# Patient Record
Sex: Female | Born: 1985 | Race: Black or African American | Hispanic: No | Smoking: Never smoker
Health system: Southern US, Community
[De-identification: ages and names within clinical notes are randomized; demographics above are authoritative.]

## PROBLEM LIST (undated history)

## (undated) ENCOUNTER — Inpatient Hospital Stay (HOSPITAL_COMMUNITY): Payer: Self-pay

## (undated) DIAGNOSIS — F32A Depression, unspecified: Secondary | ICD-10-CM

## (undated) DIAGNOSIS — F419 Anxiety disorder, unspecified: Secondary | ICD-10-CM

## (undated) DIAGNOSIS — D649 Anemia, unspecified: Secondary | ICD-10-CM

## (undated) DIAGNOSIS — R87629 Unspecified abnormal cytological findings in specimens from vagina: Secondary | ICD-10-CM

## (undated) DIAGNOSIS — M549 Dorsalgia, unspecified: Secondary | ICD-10-CM

## (undated) DIAGNOSIS — F329 Major depressive disorder, single episode, unspecified: Secondary | ICD-10-CM

## (undated) DIAGNOSIS — J45909 Unspecified asthma, uncomplicated: Secondary | ICD-10-CM

## (undated) HISTORY — PX: NO PAST SURGERIES: SHX2092

---
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
## 2004-06-06 ENCOUNTER — Emergency Department (HOSPITAL_COMMUNITY): Admission: EM | Admit: 2004-06-06 | Discharge: 2004-06-07 | Payer: Self-pay | Admitting: Emergency Medicine

## 2004-07-13 ENCOUNTER — Encounter: Admission: RE | Admit: 2004-07-13 | Discharge: 2004-07-13 | Payer: Self-pay | Admitting: Family Medicine

## 2008-02-09 ENCOUNTER — Encounter: Admission: RE | Admit: 2008-02-09 | Discharge: 2008-02-09 | Payer: Self-pay | Admitting: Internal Medicine

## 2011-07-18 ENCOUNTER — Emergency Department (HOSPITAL_COMMUNITY)
Admission: EM | Admit: 2011-07-18 | Discharge: 2011-07-18 | Disposition: A | Discharge status: Home / Self Care | Payer: Medicare HMO | Attending: Emergency Medicine | Admitting: Emergency Medicine

## 2011-07-18 DIAGNOSIS — X58XXXA Exposure to other specified factors, initial encounter: Secondary | ICD-10-CM | POA: Insufficient documentation

## 2011-07-18 DIAGNOSIS — B009 Herpesviral infection, unspecified: Secondary | ICD-10-CM | POA: Insufficient documentation

## 2011-07-18 DIAGNOSIS — R220 Localized swelling, mass and lump, head: Secondary | ICD-10-CM | POA: Insufficient documentation

## 2011-07-18 DIAGNOSIS — T7840XA Allergy, unspecified, initial encounter: Secondary | ICD-10-CM | POA: Insufficient documentation

## 2011-07-18 MED ORDER — DIPHENHYDRAMINE HCL 25 MG PO TABS: 50.0000 mg | ORAL_TABLET | Freq: Three times a day (TID) | ORAL | Status: DC | PRN

## 2011-07-18 MED ORDER — FAMOTIDINE 20 MG PO TABS
20.0000 mg | ORAL_TABLET | Freq: Once | ORAL | Status: AC
  Administered 2011-07-18: 20 mg via ORAL
  Filled 2011-07-18: qty 1

## 2011-07-18 MED ORDER — PREDNISONE 20 MG PO TABS: 60.0000 mg | ORAL_TABLET | Freq: Every day | ORAL | Status: AC

## 2011-07-18 MED ORDER — ACYCLOVIR 5 % EX OINT: TOPICAL_OINTMENT | CUTANEOUS | Status: AC

## 2011-07-18 MED ORDER — PREDNISONE 20 MG PO TABS
60.0000 mg | ORAL_TABLET | Freq: Once | ORAL | Status: AC
  Administered 2011-07-18: 60 mg via ORAL
  Filled 2011-07-18: qty 3

## 2011-07-18 MED ORDER — ACYCLOVIR 5 % EX OINT: TOPICAL_OINTMENT | CUTANEOUS | Status: DC

## 2011-07-18 MED ORDER — FAMOTIDINE 20 MG PO TABS: 20.0000 mg | ORAL_TABLET | Freq: Two times a day (BID) | ORAL | Status: DC

## 2011-07-18 MED ORDER — DIPHENHYDRAMINE HCL 25 MG PO CAPS
50.0000 mg | ORAL_CAPSULE | Freq: Once | ORAL | Status: AC
  Administered 2011-07-18: 50 mg via ORAL
  Filled 2011-07-18: qty 1

## 2011-07-18 NOTE — ED Provider Notes (Signed)
Medical screening examination/treatment/procedure(s) were performed by non-physician practitioner and as supervising physician I was immediately available for consultation/collaboration.   Rolan Bucco, MD 07/18/11 [phone]

## 2011-07-18 NOTE — ED Notes (Signed)
Pt states she woke up this am and she is swollen all over, her face, lips, hands, legs and feet

## 2011-07-18 NOTE — ED Provider Notes (Signed)
History     CSN: [id number]  Arrival date & time 07/18/11  1191   First MD Initiated Contact with Patient 07/18/11 1043      Chief Complaint  Patient presents with  . Facial Swelling  . Allergic Reaction    (Consider location/radiation/quality/duration/timing/severity/associated sxs/prior treatment) Patient is a 25 y.o. female presenting with allergic reaction. The history is provided by the patient.  Allergic Reaction The primary symptoms do not include shortness of breath, abdominal pain or rash. Primary symptoms comment: She woke this morning with feeling her hands and feet were swollen.  The current episode started 3 to 5 hours ago. The problem has not changed since onset. Significant symptoms that are not present include eye redness or itching. Associated symptoms comments: She reports swollen and painful rash on lips for the past 2 weeks that is improving. She has been taking 25 mg Benadryl twice daily for this..    Past Medical History  Diagnosis Date  . Migraine     History reviewed. No pertinent past surgical history.  History reviewed. No pertinent family history.  History  Substance Use Topics  . Smoking status: Not on file  . Smokeless tobacco: Not on file  . Alcohol Use: No    OB History    Grav Para Term Preterm Abortions TAB SAB Ect Mult Living                  Review of Systems  Constitutional: Negative for fever and chills.  HENT: Negative.   Eyes: Negative for redness.  Respiratory: Negative.  Negative for shortness of breath.   Cardiovascular: Negative.   Gastrointestinal: Negative.  Negative for abdominal pain.  Musculoskeletal:       See HPI. No joint pain or myalgias. She reports symptoms limited to an 'uncomfortable' swelling or tightness in hands and feet.  Skin: Negative.  Negative for itching and rash.  Neurological: Negative.     Allergies  Review of patient's allergies indicates no known allergies.  Home Medications   Current  Outpatient Rx  Name Route Sig Dispense Refill  . ACETAMINOPHEN 325 MG PO TABS Oral Take 650 mg by mouth every 6 (six) hours as needed. For pain.     . ALBUTEROL SULFATE HFA 108 (90 BASE) MCG/ACT IN AERS Inhalation Inhale 2 puffs into the lungs every 4 (four) hours as needed. For shortness of breath.     . CETIRIZINE HCL 10 MG PO TABS Oral Take 10 mg by mouth daily.      Marland Kitchen VITAMIN D 1000 UNITS PO TABS Oral Take 1,000 Units by mouth daily.      Marland Kitchen METHOCARBAMOL 500 MG PO TABS Oral Take 500 mg by mouth every 6 (six) hours as needed. For muscle spasms.    Gaylord Shih TRI-CYCLEN (28) PO Oral Take 1 tablet by mouth daily.      Marland Kitchen OVER THE COUNTER MEDICATION Topical Apply 1 application topically as needed. Triderma Eczema Fast Healing Cream. For eczema.     . TOPIRAMATE 25 MG PO TABS Oral Take 25 mg by mouth 2 (two) times daily.        BP 111/69  Pulse 68  Temp(Src) 98.8 F (37.1 C) (Oral)  Resp 18  SpO2 100%  Physical Exam  Constitutional: She appears well-developed and well-nourished.  HENT:  Head: Normocephalic.  Mouth/Throat: Oropharynx is clear and moist.       Rash on lower center lip associated with mild swelling. No blistering or ulcerations.  Neck: Normal range of motion. Neck supple.  Cardiovascular: Normal rate and regular rhythm.   Pulmonary/Chest: Effort normal and breath sounds normal.  Abdominal: Soft. Bowel sounds are normal. There is no tenderness. There is no rebound and no guarding.  Musculoskeletal: Normal range of motion.  Neurological: She is alert. No cranial nerve deficit.  Skin: Skin is warm and dry. No rash noted.  Psychiatric: She has a normal mood and affect.    ED Course  Procedures (including critical care time)  Labs Reviewed - No data to display No results found.   No diagnosis found.    MDM  She reports she feels better with medications in ED.         Rodena Medin, PA 07/18/11 1213

## 2013-03-27 ENCOUNTER — Encounter (HOSPITAL_COMMUNITY): Payer: Self-pay | Admitting: *Deleted

## 2013-03-27 ENCOUNTER — Inpatient Hospital Stay (HOSPITAL_COMMUNITY): Payer: Managed Care, Other (non HMO)

## 2013-03-27 ENCOUNTER — Inpatient Hospital Stay (HOSPITAL_COMMUNITY)
Admission: AD | Admit: 2013-03-27 | Discharge: 2013-03-27 | Disposition: A | Discharge status: Home / Self Care | Payer: Managed Care, Other (non HMO) | Source: Ambulatory Visit | Attending: Obstetrics & Gynecology | Admitting: Obstetrics & Gynecology

## 2013-03-27 DIAGNOSIS — O99891 Other specified diseases and conditions complicating pregnancy: Secondary | ICD-10-CM | POA: Insufficient documentation

## 2013-03-27 DIAGNOSIS — O209 Hemorrhage in early pregnancy, unspecified: Secondary | ICD-10-CM | POA: Insufficient documentation

## 2013-03-27 DIAGNOSIS — J45901 Unspecified asthma with (acute) exacerbation: Secondary | ICD-10-CM

## 2013-03-27 DIAGNOSIS — R109 Unspecified abdominal pain: Secondary | ICD-10-CM | POA: Insufficient documentation

## 2013-03-27 HISTORY — DX: Dorsalgia, unspecified: M54.9

## 2013-03-27 HISTORY — DX: Unspecified asthma, uncomplicated: J45.909

## 2013-03-27 HISTORY — DX: Anemia, unspecified: D64.9

## 2013-03-27 LAB — WET PREP, GENITAL
Clue Cells Wet Prep HPF POC: NONE SEEN
Trich, Wet Prep: NONE SEEN
Yeast Wet Prep HPF POC: NONE SEEN

## 2013-03-27 LAB — CBC
HCT: 36.6 % (ref 36.0–46.0)
Hemoglobin: 12.6 g/dL (ref 12.0–15.0)
MCH: 29.5 pg (ref 26.0–34.0)
MCHC: 34.4 g/dL (ref 30.0–36.0)
MCV: 85.7 fL (ref 78.0–100.0)
Platelets: 282 10*3/uL (ref 150–400)
RBC: 4.27 MIL/uL (ref 3.87–5.11)
RDW: 12.8 % (ref 11.5–15.5)
WBC: 6.5 10*3/uL (ref 4.0–10.5)

## 2013-03-27 LAB — ABO/RH: ABO/RH(D): B POS

## 2013-03-27 MED ORDER — ALBUTEROL SULFATE (5 MG/ML) 0.5% IN NEBU
INHALATION_SOLUTION | RESPIRATORY_TRACT | Status: AC
  Administered 2013-03-27: 2.5 mg via RESPIRATORY_TRACT
  Filled 2013-03-27: qty 0.5

## 2013-03-27 MED ORDER — CONCEPT DHA 53.5-38-1 MG PO CAPS: 1.0000 | ORAL_CAPSULE | Freq: Every day | ORAL | Status: DC

## 2013-03-27 MED ORDER — IPRATROPIUM BROMIDE 0.02 % IN SOLN
RESPIRATORY_TRACT | Status: AC
  Administered 2013-03-27: 0.5 mg via RESPIRATORY_TRACT
  Filled 2013-03-27: qty 2.5

## 2013-03-27 MED ORDER — IPRATROPIUM BROMIDE 0.02 % IN SOLN
0.5000 mg | Freq: Once | RESPIRATORY_TRACT | Status: AC
  Administered 2013-03-27: 0.5 mg via RESPIRATORY_TRACT

## 2013-03-27 MED ORDER — ALBUTEROL SULFATE (5 MG/ML) 0.5% IN NEBU
2.5000 mg | INHALATION_SOLUTION | Freq: Once | RESPIRATORY_TRACT | Status: AC
  Administered 2013-03-27: 2.5 mg via RESPIRATORY_TRACT
  Filled 2013-03-27: qty 0.5

## 2013-03-27 MED ORDER — ALBUTEROL SULFATE (5 MG/ML) 0.5% IN NEBU
2.5000 mg | INHALATION_SOLUTION | Freq: Once | RESPIRATORY_TRACT | Status: AC
  Administered 2013-03-27: 2.5 mg via RESPIRATORY_TRACT

## 2013-03-27 MED ORDER — IPRATROPIUM BROMIDE 0.02 % IN SOLN
0.5000 mg | Freq: Once | RESPIRATORY_TRACT | Status: AC
  Administered 2013-03-27: 0.5 mg via RESPIRATORY_TRACT
  Filled 2013-03-27: qty 2.5

## 2013-03-27 NOTE — MAU Provider Note (Signed)
Pt had an acute asthma exacerbation.   She responded to nebulizer in MAU and was discharged to home to f/u with primary care.  Pt was stable at discharge. Attestation of Attending Supervision of Advanced Practitioner (CNM/NP): Evaluation and management procedures were performed by the Advanced Practitioner under my supervision and collaboration.  I have reviewed the Advanced Practitioner's note and chart, and I agree with the management and plan.  HARRAWAY-SMITH, Delma Drone 4:27 PM

## 2013-03-27 NOTE — MAU Note (Signed)
C/o asthmatic symptoms since this AM around 0700;

## 2013-03-27 NOTE — MAU Provider Note (Signed)
Attestation of Attending Supervision of Advanced Practitioner (CNM/NP): Evaluation and management procedures were performed by the Advanced Practitioner under my supervision and collaboration.  I have reviewed the Advanced Practitioner's note and chart, and I agree with the management and plan.  HARRAWAY-SMITH, Ramonica Grigg 8:27 PM     
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
## 2013-03-28 LAB — GC/CHLAMYDIA PROBE AMP
CT Probe RNA: NEGATIVE
GC Probe RNA: NEGATIVE

## 2013-04-12 LAB — OB RESULTS CONSOLE ABO/RH: RH Type: POSITIVE

## 2013-04-12 LAB — OB RESULTS CONSOLE HIV ANTIBODY (ROUTINE TESTING): HIV: NONREACTIVE

## 2013-04-12 LAB — OB RESULTS CONSOLE RUBELLA ANTIBODY, IGM: Rubella: IMMUNE

## 2013-04-12 LAB — OB RESULTS CONSOLE ANTIBODY SCREEN: Antibody Screen: NEGATIVE

## 2013-04-12 LAB — OB RESULTS CONSOLE RPR: RPR: NONREACTIVE

## 2013-04-12 LAB — OB RESULTS CONSOLE HEPATITIS B SURFACE ANTIGEN: Hepatitis B Surface Ag: NEGATIVE

## 2013-04-20 LAB — OB RESULTS CONSOLE GC/CHLAMYDIA
Chlamydia: NEGATIVE
Gonorrhea: NEGATIVE

## 2013-05-15 ENCOUNTER — Inpatient Hospital Stay (HOSPITAL_COMMUNITY)
Admission: AD | Admit: 2013-05-15 | Payer: Managed Care, Other (non HMO) | Source: Ambulatory Visit | Admitting: Obstetrics and Gynecology

## 2013-07-26 NOTE — L&D Delivery Note (Signed)
Delivery Note At 4:28 AM a healthy female was delivered via Vaginal, Spontaneous Delivery (Presentation: Left Occiput Anterior).  APGAR: 8, 10; weight pending .   Placenta status: Intact, Spontaneous.  Cord: 3 vessels with the following complications: None.  Cord pH: N/A  Anesthesia: None  Episiotomy: None Lacerations: 2nd degree Suture Repair: 3.0 vicryl vicryl rapide 4-0 Est. Blood Loss (mL):   Mom to postpartum.  Baby to Nursery.  Tracy Patel 11/05/2013, 5:02 AM

## 2013-10-19 LAB — OB RESULTS CONSOLE GBS: GBS: NEGATIVE

## 2013-11-04 ENCOUNTER — Encounter (HOSPITAL_COMMUNITY): Payer: Self-pay | Admitting: *Deleted

## 2013-11-04 ENCOUNTER — Encounter (HOSPITAL_COMMUNITY): Payer: Self-pay

## 2013-11-04 ENCOUNTER — Inpatient Hospital Stay (HOSPITAL_COMMUNITY)
Admission: AD | Admit: 2013-11-04 | Discharge: 2013-11-04 | Disposition: A | Discharge status: Home / Self Care | Payer: Managed Care, Other (non HMO) | Source: Ambulatory Visit | Attending: Obstetrics and Gynecology | Admitting: Obstetrics and Gynecology

## 2013-11-04 ENCOUNTER — Inpatient Hospital Stay (HOSPITAL_COMMUNITY)
Admission: AD | Admit: 2013-11-04 | Discharge: 2013-11-07 | DRG: 775 | Disposition: A | Discharge status: Home / Self Care | Payer: Managed Care, Other (non HMO) | Source: Ambulatory Visit | Attending: Obstetrics & Gynecology | Admitting: Obstetrics & Gynecology

## 2013-11-04 DIAGNOSIS — O479 False labor, unspecified: Secondary | ICD-10-CM | POA: Insufficient documentation

## 2013-11-04 HISTORY — DX: Major depressive disorder, single episode, unspecified: F32.9

## 2013-11-04 HISTORY — DX: Anxiety disorder, unspecified: F41.9

## 2013-11-04 HISTORY — DX: Depression, unspecified: F32.A

## 2013-11-04 HISTORY — DX: Unspecified abnormal cytological findings in specimens from vagina: R87.629

## 2013-11-04 LAB — AMNISURE RUPTURE OF MEMBRANE (ROM) NOT AT ARMC: Amnisure ROM: POSITIVE

## 2013-11-04 NOTE — MAU Note (Signed)
Water broke at 10 pm, clear fluid. Contractions every 5 minutes.

## 2013-11-04 NOTE — MAU Note (Signed)
Pt complains of contractions that started around 330am. Denies vaginal discharge, bleeding or leaking of fluid. Reports good fetal movement.

## 2013-11-05 ENCOUNTER — Encounter (HOSPITAL_COMMUNITY): Payer: Self-pay | Admitting: *Deleted

## 2013-11-05 LAB — CBC
HCT: 41.1 % (ref 36.0–46.0)
Hemoglobin: 14.6 g/dL (ref 12.0–15.0)
MCH: 30.5 pg (ref 26.0–34.0)
MCHC: 35.5 g/dL (ref 30.0–36.0)
MCV: 86 fL (ref 78.0–100.0)
Platelets: 258 10*3/uL (ref 150–400)
RBC: 4.78 MIL/uL (ref 3.87–5.11)
RDW: 13.9 % (ref 11.5–15.5)
WBC: 6.1 10*3/uL (ref 4.0–10.5)

## 2013-11-05 LAB — RPR

## 2013-11-05 MED ORDER — LANOLIN HYDROUS EX OINT: TOPICAL_OINTMENT | CUTANEOUS | Status: DC | PRN

## 2013-11-05 MED ORDER — FLEET ENEMA 7-19 GM/118ML RE ENEM: 1.0000 | ENEMA | RECTAL | Status: DC | PRN

## 2013-11-05 MED ORDER — LACTATED RINGERS IV SOLN: 500.0000 mL | INTRAVENOUS | Status: DC | PRN

## 2013-11-05 MED ORDER — CITRIC ACID-SODIUM CITRATE 334-500 MG/5ML PO SOLN: 30.0000 mL | ORAL | Status: DC | PRN

## 2013-11-05 MED ORDER — SENNOSIDES-DOCUSATE SODIUM 8.6-50 MG PO TABS
2.0000 | ORAL_TABLET | ORAL | Status: DC
  Administered 2013-11-06 – 2013-11-07 (×2): 2 via ORAL
  Filled 2013-11-05 (×4): qty 2

## 2013-11-05 MED ORDER — WITCH HAZEL-GLYCERIN EX PADS: 1.0000 "application " | MEDICATED_PAD | CUTANEOUS | Status: DC | PRN

## 2013-11-05 MED ORDER — OXYCODONE-ACETAMINOPHEN 5-325 MG PO TABS: 1.0000 | ORAL_TABLET | ORAL | Status: DC | PRN

## 2013-11-05 MED ORDER — PHENYLEPHRINE 40 MCG/ML (10ML) SYRINGE FOR IV PUSH (FOR BLOOD PRESSURE SUPPORT)
80.0000 ug | PREFILLED_SYRINGE | INTRAVENOUS | Status: DC | PRN
Start: 2013-11-05 — End: 2013-11-05
  Filled 2013-11-05: qty 2

## 2013-11-05 MED ORDER — DIPHENHYDRAMINE HCL 50 MG/ML IJ SOLN: 12.5000 mg | INTRAMUSCULAR | Status: DC | PRN

## 2013-11-05 MED ORDER — OXYTOCIN 40 UNITS IN LACTATED RINGERS INFUSION - SIMPLE MED
62.5000 mL/h | INTRAVENOUS | Status: DC
  Administered 2013-11-05: 62.5 mL/h via INTRAVENOUS
  Filled 2013-11-05: qty 1000

## 2013-11-05 MED ORDER — ONDANSETRON HCL 4 MG PO TABS: 4.0000 mg | ORAL_TABLET | ORAL | Status: DC | PRN

## 2013-11-05 MED ORDER — LACTATED RINGERS IV SOLN: 500.0000 mL | Freq: Once | INTRAVENOUS | Status: DC

## 2013-11-05 MED ORDER — IBUPROFEN 600 MG PO TABS
600.0000 mg | ORAL_TABLET | Freq: Four times a day (QID) | ORAL | Status: DC
  Administered 2013-11-05 – 2013-11-07 (×7): 600 mg via ORAL
  Filled 2013-11-05 (×8): qty 1

## 2013-11-05 MED ORDER — PHENYLEPHRINE 40 MCG/ML (10ML) SYRINGE FOR IV PUSH (FOR BLOOD PRESSURE SUPPORT)
80.0000 ug | PREFILLED_SYRINGE | INTRAVENOUS | Status: DC | PRN
  Filled 2013-11-05: qty 2

## 2013-11-05 MED ORDER — EPHEDRINE 5 MG/ML INJ
10.0000 mg | INTRAVENOUS | Status: DC | PRN
  Filled 2013-11-05: qty 2

## 2013-11-05 MED ORDER — LACTATED RINGERS IV SOLN
INTRAVENOUS | Status: DC
  Administered 2013-11-05: 01:00:00 via INTRAVENOUS

## 2013-11-05 MED ORDER — OXYTOCIN BOLUS FROM INFUSION
500.0000 mL | INTRAVENOUS | Status: DC
  Administered 2013-11-05: 500 mL via INTRAVENOUS

## 2013-11-05 MED ORDER — PRENATAL MULTIVITAMIN CH
1.0000 | ORAL_TABLET | Freq: Every day | ORAL | Status: DC
  Administered 2013-11-05 – 2013-11-06 (×2): 1 via ORAL
  Filled 2013-11-05 (×2): qty 1

## 2013-11-05 MED ORDER — DIPHENHYDRAMINE HCL 25 MG PO CAPS: 25.0000 mg | ORAL_CAPSULE | Freq: Four times a day (QID) | ORAL | Status: DC | PRN

## 2013-11-05 MED ORDER — NALBUPHINE HCL 10 MG/ML IJ SOLN
5.0000 mg | INTRAMUSCULAR | Status: DC | PRN
  Administered 2013-11-05: 5 mg via SUBCUTANEOUS

## 2013-11-05 MED ORDER — FENTANYL 2.5 MCG/ML BUPIVACAINE 1/10 % EPIDURAL INFUSION (WH - ANES): 14.0000 mL/h | INTRAMUSCULAR | Status: DC | PRN

## 2013-11-05 MED ORDER — ONDANSETRON HCL 4 MG/2ML IJ SOLN: 4.0000 mg | Freq: Four times a day (QID) | INTRAMUSCULAR | Status: DC | PRN

## 2013-11-05 MED ORDER — ONDANSETRON HCL 4 MG/2ML IJ SOLN: 4.0000 mg | INTRAMUSCULAR | Status: DC | PRN

## 2013-11-05 MED ORDER — ZOLPIDEM TARTRATE 5 MG PO TABS: 5.0000 mg | ORAL_TABLET | Freq: Every evening | ORAL | Status: DC | PRN

## 2013-11-05 MED ORDER — SIMETHICONE 80 MG PO CHEW: 80.0000 mg | CHEWABLE_TABLET | ORAL | Status: DC | PRN

## 2013-11-05 MED ORDER — ACETAMINOPHEN 325 MG PO TABS: 650.0000 mg | ORAL_TABLET | ORAL | Status: DC | PRN

## 2013-11-05 MED ORDER — IBUPROFEN 600 MG PO TABS
600.0000 mg | ORAL_TABLET | Freq: Four times a day (QID) | ORAL | Status: DC | PRN
  Administered 2013-11-05: 600 mg via ORAL
  Filled 2013-11-05: qty 1

## 2013-11-05 MED ORDER — DIBUCAINE 1 % RE OINT
1.0000 "application " | TOPICAL_OINTMENT | RECTAL | Status: DC | PRN
  Filled 2013-11-05: qty 28

## 2013-11-05 MED ORDER — LIDOCAINE HCL (PF) 1 % IJ SOLN
30.0000 mL | INTRAMUSCULAR | Status: DC | PRN
  Administered 2013-11-05: 30 mL via SUBCUTANEOUS
  Filled 2013-11-05: qty 30

## 2013-11-05 MED ORDER — NALBUPHINE HCL 10 MG/ML IJ SOLN
5.0000 mg | INTRAMUSCULAR | Status: DC | PRN
  Administered 2013-11-05: 5 mg via INTRAVENOUS
  Filled 2013-11-05: qty 1

## 2013-11-05 MED ORDER — OXYTOCIN 40 UNITS IN LACTATED RINGERS INFUSION - SIMPLE MED: 62.5000 mL/h | INTRAVENOUS | Status: DC | PRN

## 2013-11-05 MED ORDER — TETANUS-DIPHTH-ACELL PERTUSSIS 5-2.5-18.5 LF-MCG/0.5 IM SUSP: 0.5000 mL | Freq: Once | INTRAMUSCULAR | Status: DC

## 2013-11-05 MED ORDER — BENZOCAINE-MENTHOL 20-0.5 % EX AERO
1.0000 "application " | INHALATION_SPRAY | CUTANEOUS | Status: DC | PRN
  Administered 2013-11-05: 1 via TOPICAL
  Filled 2013-11-05: qty 56

## 2013-11-05 NOTE — Lactation Note (Signed)
This note was copied from the chart of Tracy Blanchard ManeCameron Cleary. Lactation Consultation Note Initial consult:  Mother's nipples invert when compressed. Melissa RN has given mother shells and hand pump. Assisted mother in placing baby in football position.  Reviewed hand expression and was able to express a small drop. Unable to latch baby with breast compression.  Introduced #20 NS.  Baby latched easily, rhythmical sucks observed. Reviewed basics, breast massage, LC/OP services and brochure and watching for colostrum in the NS.  Patient Name: Tracy Patel WUJWJ'XToday's Date: 11/05/2013 Reason for consult: Initial assessment   Maternal Data Infant to breast within first hour of birth: Yes (unable to sustain latch) Has patient been taught Hand Expression?: Yes Does the patient have breastfeeding experience prior to this delivery?: No  Feeding Feeding Type: Breast Fed Length of feed: 30 min  LATCH Score/Interventions Latch: Repeated attempts needed to sustain latch, nipple held in mouth throughout feeding, stimulation needed to elicit sucking reflex. Intervention(s): Skin to skin;Teach feeding cues;Waking techniques Intervention(s): Adjust position;Assist with latch;Breast massage;Breast compression  Audible Swallowing: A few with stimulation Intervention(s): Skin to skin  Type of Nipple: Flat Intervention(s): Shells;Hand pump  Comfort (Breast/Nipple): Soft / non-tender     Hold (Positioning): Assistance needed to correctly position infant at breast and maintain latch. Intervention(s): Breastfeeding basics reviewed;Support Pillows;Position options;Skin to skin  LATCH Score: 6  Lactation Tools Discussed/Used Tools: Shells;Nipple Shields Nipple shield size: 20 Shell Type: Inverted   Consult Status Consult Status: Follow-up Date: 11/06/13 Follow-up type: In-patient    Dulce SellarRuth Boschen Tracy Patel 11/05/2013, 11:52 AM

## 2013-11-05 NOTE — H&P (Signed)
Subjective:  Tracy Patel is a 28 y.o. G2 P0 female with EDC 11/12/13 at 3439 and 0/[redacted] weeks gestation who is being admitted for labor management.  Her current obstetrical history is normal.  Patient reports regular painful contractions since 11 pm.  SROM with clear fluid x 10 pm.  Fetal Movement: normal.     Objective:   Vital signs in last 24 hours: Temp:  [97.7 F (36.5 C)-99.5 F (37.5 C)] 99.5 F (37.5 C) (04/13 0439) Pulse Rate:  [61-90] 82 (04/13 0439) Resp:  [16-24] 18 (04/13 0439) BP: (96-141)/(62-83) 141/69 mmHg (04/13 0439) SpO2:  [100 %] 100 % (04/12 2303) Weight:  [95.709 kg (211 lb)] 95.709 kg (211 lb) (04/13 0045)   General:   alert  Skin:   normal  HEENT:  PERRLA  Lungs:   clear to auscultation bilaterally  Heart:   regular rate and rhythm  Breasts:   Deferred  Abdomen:  Gravid  Pelvis:  Exam deferred.  FHT:  130's BPM, good variability, no deceleration  Uterine Size: size equals dates  Presentations: cephalic  Cervix:    Dilation: 3cm   Effacement: 100%   Station:  -1   Consistency: soft   Position: middle                                AF clear, Amniosure pos. Lab Review  B, Rh+  ZOX:WRUEAVWAFP:patient declined  US anato wnl  One hour GTT: Normal   GBS neg  Assessment/Plan:  39 and 0/[redacted] weeks gestation. Active phase labor.  SROM, clear.  Fetal well being reassuring. Obstetrical history normal.    Risks, benefits, alternatives and possible complications have been discussed in detail with the patient.  Pre-admission, admission, and post admission procedures and expectations were discussed in detail.  All questions answered, all appropriate consents will be signed at the Hospital. Admission is planned for today.  Expectant management. and Anticipate vaginal delivery.

## 2013-11-06 LAB — CBC
HCT: 33.8 % — ABNORMAL LOW (ref 36.0–46.0)
Hemoglobin: 11.5 g/dL — ABNORMAL LOW (ref 12.0–15.0)
MCH: 29.6 pg (ref 26.0–34.0)
MCHC: 34 g/dL (ref 30.0–36.0)
MCV: 86.9 fL (ref 78.0–100.0)
Platelets: 215 10*3/uL (ref 150–400)
RBC: 3.89 MIL/uL (ref 3.87–5.11)
RDW: 14.3 % (ref 11.5–15.5)
WBC: 9.9 10*3/uL (ref 4.0–10.5)

## 2013-11-06 NOTE — Clinical Social Work Psychosocial (Signed)
    Clinical Social Work Department BRIEF PSYCHOSOCIAL ASSESSMENT 11/06/2013  Patient:  Geoffery SpruceTERSON,Marilena S     Account Number:  [id number]     Admit date:  11/04/2013  Clinical Social Worker:  Melene PlanSLADE,Taylor Spilde, LCSW  Date/Time:  11/06/2013 03:21 PM  Referred by:  Physician  Date Referred:  11/06/2013 Referred for  Behavioral Health Issues   Other Referral:   Interview type:  Patient Other interview type:    PSYCHOSOCIAL DATA Living Status:  PARENTS Admitted from facility:   Level of care:   Primary support name:  Roselyn Beringegina Countess Primary support relationship to patient:  PARENT Degree of support available:   Involved    CURRENT CONCERNS Current Concerns  Behavioral Health Issues   Other Concerns:    SOCIAL WORK ASSESSMENT / PLAN CSW referral received to assess pts history of depression/anxiety.  Pt was accompanied by her mother but gave CSW verbal permission to speak in her presence.  Pt acknowledged history of depression as said "it's always been a problem."  She was diagnosed with depression in 7 years ago by her physician Dr. Renae GlossShelton.  Pts symptoms were never treated with medication.  She learned other ways to cope with depression, such as participating in yoga, meditating, change in diet & "color therapy."  According to pt, all suggestions were helpful.  She denies any depressed moods in "a couple of months."  She denies any SI.  Pt reports feeing fine now.  She identified her mother (who is also a therapist), as her primary support person.  FOB, Willene HatchetReginald Wilkerson is also a good support person.  CSW discussed the signs/symptoms of PP depression & encouraged her to seek medical attention if needed.  Pt appears appropriate at this time & seems to  be bonding well.  CSW will continue to assist further if needed.   Assessment/plan status:  No Further Intervention Required Other assessment/ plan:   Information/referral to community resources:   Feelings After [employer] Brochure     PATIENT'S/FAMILY'S RESPONSE TO PLAN OF CARE: Pt seemed appreciative of consult & resource offered.

## 2013-11-06 NOTE — Progress Notes (Signed)
Patient ID: Tracy Patel, female   DOB: 1986-02-23, 28 y.o.   MRN: [id number] PPD # 1  Subjective: Pt reports feeling well, somewhat sore / Pain controlled with ibuprofen Tolerating po/ Voiding without problems/ No n/v Bleeding is light Newborn info:  Information for the patient's newborn:  Tracy Patel, Girl [name] [[id number]][[id number]]  female Feeding: breast   Objective:  VS: Blood pressure 98/66, pulse 81, temperature 98.4 F (36.9 C), temperature source Oral, resp. rate 18.    Recent Labs  11/05/13 0019 11/06/13 0550  WBC 6.1 9.9  HGB 14.6 11.5*  HCT 41.1 33.8*  PLT 258 215    Blood type: B/Positive Rubella: Immune    Physical Exam:  General: alert, cooperative and no distress CV: Regular rate and rhythm Resp: clear Abdomen: soft, nontender, normal bowel sounds Uterine Fundus: firm, below umbilicus, nontender Perineum: healing with good reapproximation Lochia: minimal Ext: Homans sign is negative, no sign of DVT and no edema, redness or tenderness in the calves or thighs   A/P: PPD # 1/ G1P1001/ S/P: SVD w/2nd deg lac with repair Doing well Continue routine post partum orders Anticipate D/C home in AM    Demetrius RevelJulie K Rasheeda [place]ve[place], MSN, [facility] HospitalWHNP 11/06/2013, 1:00 PM

## 2013-11-07 MED ORDER — IBUPROFEN 600 MG PO TABS: 600.0000 mg | ORAL_TABLET | Freq: Four times a day (QID) | ORAL | Status: DC

## 2013-11-07 NOTE — Progress Notes (Signed)
PPD #2- SVD  Subjective:   Reports feeling well Tolerating po/ No nausea or vomiting Bleeding is light Pain controlled with Motrin Up ad lib / ambulatory / voiding without problems Newborn: breastfeeding     Objective:   VS: VS:  Filed Vitals:   11/05/13 0045 11/06/13 0539 11/06/13 1801 11/07/13 0605  BP:  98/66 95/61 107/66  Pulse:  81 67 50  Temp:  98.4 F (36.9 C) 98 F (36.7 C) 98.4 F (36.9 C)  TempSrc:  Oral Oral Oral  Resp:  18 18 18   Height: 5\' 3"  (1.6 m)     Weight: 95.709 kg (211 lb)     SpO2:        LABS:  Recent Labs  11/05/13 0019 11/06/13 0550  WBC 6.1 9.9  HGB 14.6 11.5*  PLT 258 215   Blood type: B/Positive/-- (09/18 0000) Rubella: Immune (09/18 0000)                I&O: Intake/Output   None     Physical Exam: Alert and oriented X3 Abdomen: soft, non-tender, non-distended  Fundus: firm, non-tender, U-1 Perineum: Well approximated, no significant erythema, edema, or drainage; healing well. Lochia: small Extremities: no edema, no calf pain or tenderness    Assessment: PPD # 2 G1P1001/ S/P:spontaneous vaginal, 2nd degree laceration Doing well - stable for discharge home   Plan: Discharge home RX's:  Ibuprofen 600mg  po Q 6 hrs prn pain #30 Refill x 0 Routine pp visit in [facility] booklet given    Lawernce PittsMelanie N Lorance Pickeral MSN, CNM 11/07/2013, 8:49 AM

## 2013-11-07 NOTE — Lactation Note (Signed)
This note was copied from the chart of Tracy Blanchard ManeCameron Langstaff. Lactation Consultation Note Follow up:  [facility] 4/21 9 am Mother using #20 NS.  Mother's nipples invert with compression. Provided mother with extra shield. Mother states her DEBP will arrive today or tomorrow.  Offered to rent her a pump.  Mother refused and states she will use her hand pump. Encouraged mother to post pump 4-6 times a day for 15 min. give baby back volume pumped either with spoon or slow flow bottle nipple. Reviewed volume guidelines and engorgement care.   Patient Name: Tracy Patel WUJWJ'XToday's Date: 11/07/2013 Reason for consult: Follow-up assessment   Maternal Data    Feeding    LATCH Score/Interventions                      Lactation Tools Discussed/Used     Consult Status Consult Status: Follow-up Date: 11/08/13 Follow-up type: Out-patient    Dulce SellarRuth Boschen Dmitri Pettigrew 11/07/2013, 9:47 AM

## 2013-11-07 NOTE — Discharge Summary (Signed)
Obstetric Discharge Summary Reason for Admission: onset of labor and rupture of membranes Prenatal Procedures: ultrasound Intrapartum Procedures: spontaneous vaginal delivery Postpartum Procedures: none Complications-Operative and Postpartum: 2nd degree perineal laceration Hemoglobin  Date Value Ref Range Status  11/06/2013 11.5* 12.0 - 15.0 g/dL Final     REPEATED TO VERIFY     DELTA CHECK NOTED     HCT  Date Value Ref Range Status  11/06/2013 33.8* 36.0 - 46.0 % Final    Physical Exam:  General: alert and cooperative Lochia: appropriate Uterine Fundus: firm Incision: healing well, no significant drainage, no dehiscence, no significant erythema DVT Evaluation: No evidence of DVT seen on physical exam. Negative Homan's sign. No cords or calf tenderness. No significant calf/ankle edema.  Discharge Diagnoses: Term Pregnancy-delivered  Discharge Information: Date: 11/07/2013 Activity: pelvic rest Diet: routine Medications: PNV and Ibuprofen Condition: stable Instructions: refer to practice specific booklet Discharge to: home Follow-up Information   Follow up with COUSINS,SHERONETTE A, MD. Schedule an appointment as soon as possible for a visit in 6 weeks.   Specialty:  Obstetrics and Gynecology   Contact information:   [address] [place] [id number] [phone]       Newborn Data: Live born female on 11/05/13 Birth Weight: 5 lb 15.9 oz (2719 g) APGAR: 8, 10  Home with mother.  Lawernce PittsMelanie N Kazuko Clemence 11/07/2013, 2:33 PM

## 2013-11-13 ENCOUNTER — Ambulatory Visit (HOSPITAL_COMMUNITY)
Admit: 2013-11-13 | Discharge: 2013-11-13 | Disposition: A | Discharge status: Home / Self Care | Payer: Managed Care, Other (non HMO) | Attending: Obstetrics and Gynecology | Admitting: Obstetrics and Gynecology

## 2013-11-13 NOTE — Lactation Note (Addendum)
Adult Lactation Consultation Outpatient Visit Note  Patient Name: Geoffery SpruceCameron S Lutze Date of Birth: 21-Mar-1986 Gestational Age at Delivery: Unknown Type of Delivery:  Baby "Norielle's " Birth date - [birth year] Carl Best( Norielle  Clapp - Jearl KlinefelterWilkerson )  Birth weight - 5-15.9 oz  D/C weight - 5-7.7 oz - 4/15  1st Dr.Visit - 5-8.6 oz - 4/17   smart start - 5-10.6 oz 4/20   Reason for today's visit - F/U due to using the nipple shield and weight check , last feeding at 7 am for 10 mins   Breastfeeding History: Per mom was started on a nipple shield in the hospital #20 , and have had to use it with latching.  Frequency of Breastfeeding: per mom 4x's to the breast in 24 hours  Length of Feeding: 10 min feedings and then she releases and comes off  Voids:  5- 6 per mom  Stools: 1 per day greenish brown   Supplementing / Method: per mom with EBM - in a bottle ( natural nipple , similar to a Dr. Manson PasseyBrown nipple )  Pumping:  Type of Pump: DEBP " Charolotte CapuchinFreebie ( per mom as strong as the Medela )    Frequency: 3 X's a day for 20 -25 mins   Volume:  2 oz if she receives a bottle , and if it's after she feeds at the breast it is 1 1/2 - 2oz   Comments: per mom comfortable with pumping , and the flatness of the nipple doesn't change much .     Consultation Evaluation: Baby awake, showing feeding cues ( per mom last feeding at 7 am for 10 min ), skin noted to                                             have areas of dryness and peeling. Mucous membranes moist.                                             Mom - breast full bilaterally , no plug ducts or engorgement , just very full . Per mom nipples are                                            naturally flat and having to use a nipple shield when feeding at the breast . LC assessed and noted                                            the nipples to be flat , and non - compressible areolas, steady flow of milk noted.  Initial Feeding Assessment: Pre-feed  Weight:5-9.5 oz 2537 g  Post-feed Weight:5-10.5 oz 2566 g  Amount Transferred: 29 ml  Comments: LC watched mom apply #20 Nipple shield ( which she was discharged with ), LC noted a very small portion of the nipple coming                     up into the nipple shield . LC questioning  whether moms breast tissue is a candidate for a nipple shield use. Mom latched the baby,                      LC assisting with depth, positioning, and flipping baby's upper lip and ease done chin. Baby noted to feed in a consistent swallowing pattern                        increased with breast compressions. Baby fell asleep after feeding 8-10 mins and released. ( per mom the baby usually feeds 10 mins, also                        Has a tendency to hang out at the breast. LC noted milk in the nipple shield, but still not much of the nipple in the nipple shield.                        LC questioning if the areola is getting enough stimulation to protect milk supply. LC changed the size of the Nipple shield, ( see below for details of feeding)   Additional Feeding Assessment: Pre-feed Weight: 5-10.5 oz 2566 g  Post-feed Weight: 5-11.0 oz 2580 g  Amount Transferred: 14 ml  Comments: LC showed mom how to roll #24 Nipple shield onto the areola to get more nipple and areola in it. The #24 NS noted to accommodate the areola more . Had mom  re- pump with hand pump 8-10 strokes to enhance making the nipple more erect . Small am't results.                       Better fit compared to the #20 NS , but still boarder line . Baby latched , LC assisted mom with depth, checking lip line , flipping upper lip open , and easing chin down ward.                      Baby fed for another 10 -15 mins with multiply swallows, less non -nutritive sucking , increased  swallows with breast compressions. Breast softened. Per mom comfortable.  Additional Feeding Assessment :  Pre-feed Weight: 5-11.0 oz 2580 g  Post-feed Weight: 5-11.5 oz 2594 g   Amount Transferred: 14 ml  Comments:switched to left breast , noted flat tissue, non - compressible areola , steady flow of milk when hand express, Had mom apply nipple shield . LC noted the #24 NS to fit better than the #20 NS  Baby latched with depth and fed for 10 mins , noted multiply swallows, increased with breast compressions. Per mom comfortable with latch. Baby released after 10 mins , seemed content and spit small am't milk.  Milk noted in nipple shield , nipple pulled out more so than then with the #20 NS.    Total Breast milk Transferred this Visit: 57 ml LC  summary comment - Concerns , only 1 stool a day greenish brown, voids adequate ,                                           Challenging tissue, boarder line fit of the nipple shield  Plenty of milk, possibly not getting to the fatty milk consistently due to large amount foremilk, and having periods of hanging out at the breast ( non- nutritive sucking )   Total Supplement Given: none                                             Mom aware of the the importance of watching for this feeding behavior ( see the detailed LC plan of Care. )   Lactation Plan of Care - Praised mom for her breast feeding efforts                                       - Feedings - By 3 hours , if Nirileela not awake , wake her , skin to skin feeds , if awake and sluggish , give appetizer of EBM with a bottle                                       - Steps for latching - 10 breast massage , hand express, pre pump to make the nipple and areola more elastic so the Nipple shield will fit better.( hand pump)                                       - Apply the #24 Nipple shield                                       - Instill EBM with syringe into the top of the nipple shield                                       - Latch with firm support ( watch for non -nutritive feedings,   Due to slow weight gain - and only one stool a day  - supplementing when baby has a sluggish feeding of 25-30 ml of EBM will increase weight and increase stooling.                                        - Post pump - 10-15 mins both breast after 4-6 feeding 's day 10 -15 mins                                              Follow-Up-4/27 at 1pm , Lactation F/U to reassess weight              - Per mom Smart start will F/U next week , LC suggested weight check Friday 4/24              - Per mom Dr. Hyacinth MeekerMiller Friday May 1st       Kathrin GreathouseMargaret Ann Lois Slagel 11/13/2013, 9:42  AM    

## 2013-11-19 ENCOUNTER — Ambulatory Visit (HOSPITAL_COMMUNITY): Admission: RE | Admit: 2013-11-19 | Payer: Managed Care, Other (non HMO) | Source: Ambulatory Visit

## 2014-05-27 ENCOUNTER — Encounter (HOSPITAL_COMMUNITY): Payer: Self-pay | Admitting: *Deleted

## 2016-07-21 ENCOUNTER — Other Ambulatory Visit: Payer: Self-pay | Admitting: Endocrinology

## 2016-07-21 DIAGNOSIS — N912 Amenorrhea, unspecified: Secondary | ICD-10-CM

## 2016-07-30 ENCOUNTER — Ambulatory Visit
Admission: RE | Admit: 2016-07-30 | Discharge: 2016-07-30 | Disposition: A | Discharge status: Home / Self Care | Payer: Managed Care, Other (non HMO) | Source: Ambulatory Visit | Attending: Endocrinology | Admitting: Endocrinology

## 2016-07-30 DIAGNOSIS — N912 Amenorrhea, unspecified: Secondary | ICD-10-CM

## 2016-07-30 MED ORDER — GADOBENATE DIMEGLUMINE 529 MG/ML IV SOLN
10.0000 mL | Freq: Once | INTRAVENOUS | Status: AC | PRN
  Administered 2016-07-30: 9 mL via INTRAVENOUS

## 2019-04-24 ENCOUNTER — Encounter: Payer: Self-pay | Admitting: Certified Nurse Midwife

## 2019-04-24 ENCOUNTER — Ambulatory Visit (INDEPENDENT_AMBULATORY_CARE_PROVIDER_SITE_OTHER): Payer: Medicaid Other | Admitting: Certified Nurse Midwife

## 2019-04-24 ENCOUNTER — Other Ambulatory Visit: Payer: Self-pay

## 2019-04-24 VITALS — BP 117/74 | HR 73 | Ht 64.0 in | Wt 169.2 lb

## 2019-04-24 DIAGNOSIS — N926 Irregular menstruation, unspecified: Secondary | ICD-10-CM | POA: Diagnosis not present

## 2019-04-24 DIAGNOSIS — Z3201 Encounter for pregnancy test, result positive: Secondary | ICD-10-CM

## 2019-04-24 LAB — POCT URINE PREGNANCY: Preg Test, Ur: POSITIVE — AB

## 2019-04-24 MED ORDER — BONJESTA 20-20 MG PO TBCR: 1.0000 | EXTENDED_RELEASE_TABLET | Freq: Two times a day (BID) | ORAL | 4 refills | Status: DC

## 2019-04-24 NOTE — Progress Notes (Signed)
Subjective:    Tracy Patel is a 33 y.o. female who presents for evaluation of amenorrhea. She believes she could be pregnant. Pregnancy is desired. Sexual Activity: single partner, contraception: none. Current symptoms also include: breast tenderness and nausea. Last period was normal.   Patient's last menstrual period was 02/28/2019 (exact date). The following portions of the patient's history were reviewed and updated as appropriate: allergies, current medications, past family history, past medical history, past social history, past surgical history and problem list.  Review of Systems Pertinent items are noted in HPI.     Objective:    BP 117/74   Pulse 73   Ht 5\' 4"  (1.626 m)   Wt 169 lb 3 oz (76.7 kg)   LMP 02/28/2019 (Exact Date)   BMI 29.04 kg/m  General: alert, cooperative, appears stated age, no distress and no acute distress    Lab Review Urine HCG: positive    Assessment:    Absence of menstruation.     Plan:  Positive: EDC: .12/05/19 Briefly discussed pre-natal care options. Discussed Midwifery or MD care.  Encouraged well-balanced diet, plenty of rest when needed, pre-natal vitamins daily and walking for exercise. Discussed self-help for nausea, avoiding OTC medications until consulting provider or pharmacist, other than Tylenol as needed, minimal caffeine (1-2 cups daily) and avoiding alcohol. She will schedule her u/s for dating as soon as able, her nurse visit @ 10 wks and her initial NOB physical exam @ 12 wks.  Feel free to call with any questions.  [name], CNM

## 2019-04-26 ENCOUNTER — Ambulatory Visit (INDEPENDENT_AMBULATORY_CARE_PROVIDER_SITE_OTHER): Payer: Medicaid Other

## 2019-04-26 ENCOUNTER — Other Ambulatory Visit: Payer: Self-pay

## 2019-04-26 DIAGNOSIS — N926 Irregular menstruation, unspecified: Secondary | ICD-10-CM

## 2019-04-26 DIAGNOSIS — Z3687 Encounter for antenatal screening for uncertain dates: Secondary | ICD-10-CM

## 2019-05-10 ENCOUNTER — Other Ambulatory Visit: Payer: Self-pay

## 2019-05-10 ENCOUNTER — Ambulatory Visit (INDEPENDENT_AMBULATORY_CARE_PROVIDER_SITE_OTHER): Payer: Medicaid Other | Admitting: Certified Nurse Midwife

## 2019-05-10 VITALS — BP 102/69 | HR 79 | Ht 64.0 in | Wt 169.4 lb

## 2019-05-10 DIAGNOSIS — Z0283 Encounter for blood-alcohol and blood-drug test: Secondary | ICD-10-CM

## 2019-05-10 DIAGNOSIS — Z113 Encounter for screening for infections with a predominantly sexual mode of transmission: Secondary | ICD-10-CM

## 2019-05-10 DIAGNOSIS — Z3490 Encounter for supervision of normal pregnancy, unspecified, unspecified trimester: Secondary | ICD-10-CM | POA: Insufficient documentation

## 2019-05-10 DIAGNOSIS — Z3481 Encounter for supervision of other normal pregnancy, first trimester: Secondary | ICD-10-CM

## 2019-05-10 MED ORDER — ONDANSETRON HCL 4 MG PO TABS: 4.0000 mg | ORAL_TABLET | Freq: Three times a day (TID) | ORAL | 2 refills | Status: DC | PRN

## 2019-05-10 NOTE — Patient Instructions (Incomplete)
WHAT OB PATIENTS CAN EXPECT   Confirmation of pregnancy and ultrasound ordered if medically indicated-[redacted] weeks gestation  New OB (NOB) intake with nurse and New OB (NOB) labs- [redacted] weeks gestation  New OB (NOB) physical examination with provider- 11/[redacted] weeks gestation  Flu vaccine-[redacted] weeks gestation  Anatomy scan-[redacted] weeks gestation  Glucose tolerance test, blood work to test for anemia, T-dap vaccine-[redacted] weeks gestation  Vaginal swabs/cultures-STD/Group B strep-[redacted] weeks gestation  Appointments every 4 weeks until 28 weeks  Every 2 weeks from 28 weeks until 36 weeks  Weekly visits from 36 weeks until delivery  Morning Sickness  Morning sickness is when you feel sick to your stomach (nauseous) during pregnancy. You may feel sick to your stomach and throw up (vomit). You may feel sick in the morning, but you can feel this way at any time of day. Some women feel very sick to their stomach and cannot stop throwing up (hyperemesis gravidarum). Follow these instructions at home: Medicines  Take over-the-counter and prescription medicines only as told by your doctor. Do not take any medicines until you talk with your doctor about them first.  Taking multivitamins before getting pregnant can stop or lessen the harshness of morning sickness. Eating and drinking  Eat dry toast or crackers before getting out of bed.  Eat 5 or 6 small meals a day.  Eat dry and bland foods like rice and baked potatoes.  Do not eat greasy, fatty, or spicy foods.  Have someone cook for you if the smell of food causes you to feel sick or throw up.  If you feel sick to your stomach after taking prenatal vitamins, take them at night or with a snack.  Eat protein when you need a snack. Nuts, yogurt, and cheese are good choices.  Drink fluids throughout the day.  Try ginger ale made with real ginger, ginger tea made from fresh grated ginger, or ginger candies. General instructions  Do not use any products  that have nicotine or tobacco in them, such as cigarettes and e-cigarettes. If you need help quitting, ask your doctor.  Use an air purifier to keep the air in your house free of smells.  Get lots of fresh air.  Try to avoid smells that make you feel sick.  Try: ? Wearing a bracelet that is used for seasickness (acupressure wristband). ? Going to a doctor who puts thin needles into certain body points (acupuncture) to improve how you feel. Contact a doctor if:  You need medicine to feel better.  You feel dizzy or light-headed.  You are losing weight. Get help right away if:  You feel very sick to your stomach and cannot stop throwing up.  You pass out (faint).  You have very bad pain in your belly. Summary  Morning sickness is when you feel sick to your stomach (nauseous) during pregnancy.  You may feel sick in the morning, but you can feel this way at any time of day.  Making some changes to what you eat may help your symptoms go away. This information is not intended to replace advice given to you by your health care provider. Make sure you discuss any questions you have with your health care provider. Document Released: 08/19/2004 Document Revised: 06/24/2017 Document Reviewed: 08/12/2016 Elsevier Patient Education  2020 [employer]. Prenatal Care Prenatal care is health care during pregnancy. It helps you and your unborn baby (fetus) stay as healthy as possible. Prenatal care may be provided by a midwife, a  family practice health care provider, or a childbirth and pregnancy specialist (obstetrician). How does this affect me? During pregnancy, you will be closely monitored for any new conditions that might develop. To lower your risk of pregnancy complications, you and your health care provider will talk about any underlying conditions you have. How does this affect my baby? Early and consistent prenatal care increases the chance that your baby will be healthy during  pregnancy. Prenatal care lowers the risk that your baby will be:  Born early (prematurely).  Smaller than expected at birth (small for gestational age). What can I expect at the first prenatal care visit? Your first prenatal care visit will likely be the longest. You should schedule your first prenatal care visit as soon as you know that you are pregnant. Your first visit is a good time to talk about any questions or concerns you have about pregnancy. At your visit, you and your health care provider will talk about:  Your medical history, including: ? Any past pregnancies. ? Your family's medical history. ? The baby's father's medical history. ? Any long-term (chronic) health conditions you have and how you manage them. ? Any surgeries or procedures you have had. ? Any current over-the-counter or prescription medicines, herbs, or supplements you are taking.  Other factors that could pose a risk to your baby, including:  Your home setting and your stress levels, including: ? Exposure to abuse or violence. ? Household financial strain. ? Mental health conditions you have.  Your daily health habits, including diet and exercise. Your health care provider will also:  Measure your weight, height, and blood pressure.  Do a physical exam, including a pelvic and breast exam.  Perform blood tests and urine tests to check for: ? Urinary tract infection. ? Sexually transmitted infections (STIs). ? Low iron levels in your blood (anemia). ? Blood type and certain proteins on red blood cells (Rh antibodies). ? Infections and immunity to viruses, such as hepatitis B and rubella. ? HIV (human immunodeficiency virus).  Do an ultrasound to confirm your baby's growth and development and to help predict your estimated due date (EDD). This ultrasound is done with a probe that is inserted into the vagina (transvaginal ultrasound).  Discuss your options for genetic screening.  Give you information  about how to keep yourself and your baby healthy, including: ? Nutrition and taking vitamins. ? Physical activity. ? How to manage pregnancy symptoms such as nausea and vomiting (morning sickness). ? Infections and substances that may be harmful to your baby and how to avoid them. ? Food safety. ? Dental care. ? Working. ? Travel. ? Warning signs to watch for and when to call your health care provider. How often will I have prenatal care visits? After your first prenatal care visit, you will have regular visits throughout your pregnancy. The visit schedule is often as follows:  Up to week 28 of pregnancy: once every 4 weeks.  28-36 weeks: once every 2 weeks.  After 36 weeks: every week until delivery. Some women may have visits more or less often depending on any underlying health conditions and the health of the baby. Keep all follow-up and prenatal care visits as told by your health care provider. This is important. What happens during routine prenatal care visits? Your health care provider will:  Measure your weight and blood pressure.  Check for fetal heart sounds.  Measure the height of your uterus in your abdomen (fundal height). This may be measured starting  around week 20 of pregnancy.  Check the position of your baby inside your uterus.  Ask questions about your diet, sleeping patterns, and whether you can feel the baby move.  Review warning signs to watch for and signs of labor.  Ask about any pregnancy symptoms you are having and how you are dealing with them. Symptoms may include: ? Headaches. ? Nausea and vomiting. ? Vaginal discharge. ? Swelling. ? Fatigue. ? Constipation. ? Any discomfort, including back or pelvic pain. Make a list of questions to ask your health care provider at your routine visits. What tests might I have during prenatal care visits? You may have blood, urine, and imaging tests throughout your pregnancy, such as:  Urine tests to check  for glucose, protein, or signs of infection.  Glucose tests to check for a form of diabetes that can develop during pregnancy (gestational diabetes mellitus). This is usually done around week 24 of pregnancy.  An ultrasound to check your baby's growth and development and to check for birth defects. This is usually done around week 20 of pregnancy.  A test to check for group B strep (GBS) infection. This is usually done around week 36 of pregnancy.  Genetic testing. This may include blood or imaging tests, such as an ultrasound. Some genetic tests are done during the first trimester and some are done during the second trimester. What else can I expect during prenatal care visits? Your health care provider may recommend getting certain vaccines during pregnancy. These may include:  A yearly flu shot (annual influenza vaccine). This is especially important if you will be pregnant during flu season.  Tdap (tetanus, diphtheria, pertussis) vaccine. Getting this vaccine during pregnancy can protect your baby from whooping cough (pertussis) after birth. This vaccine may be recommended between weeks 27 and 36 of pregnancy. Later in your pregnancy, your health care provider may give you information about:  Childbirth and breastfeeding classes.  Choosing a health care provider for your baby.  Umbilical cord banking.  Breastfeeding.  Birth control after your baby is born.  The hospital labor and delivery unit and how to tour it.  Registering at the hospital before you go into labor. Where to find more information  Office on Women's Health: LegalWarrants.gl  American Pregnancy Association: americanpregnancy.org  March of Dimes: marchofdimes.org Summary  Prenatal care helps you and your baby stay as healthy as possible during pregnancy.  Your first prenatal care visit will most likely be the longest.  You will have visits and tests throughout your pregnancy to monitor your health and  your baby's health.  Bring a list of questions to your visits to ask your health care provider.  Make sure to keep all follow-up and prenatal care visits with your health care provider. This information is not intended to replace advice given to you by your health care provider. Make sure you discuss any questions you have with your health care provider. Document Released: 07/15/2003 Document Revised: 11/01/2018 Document Reviewed: 07/11/2017 Elsevier Patient Education  2020 [employer]. How a Baby Grows During Pregnancy  Pregnancy begins when a female's sperm enters a female's egg (fertilization). Fertilization usually happens in one of the tubes (fallopian tubes) that connect the ovaries to the womb (uterus). The fertilized egg moves down the fallopian tube to the uterus. Once it reaches the uterus, it implants into the lining of the uterus and begins to grow. For the first 10 weeks, the fertilized egg is called an embryo. After 10 weeks, it  is called a fetus. As the fetus continues to grow, it receives oxygen and nutrients through tissue (placenta) that grows to support the developing baby. The placenta is the life support system for the baby. It provides oxygen and nutrition and removes waste. Learning as much as you can about your pregnancy and how your baby is developing can help you enjoy the experience. It can also make you aware of when there might be a problem and when to ask questions. How long does a typical pregnancy last? A pregnancy usually lasts 280 days, or about 40 weeks. Pregnancy is divided into three periods of growth, also called trimesters:  First trimester: 0-12 weeks.  Second trimester: 13-27 weeks.  Third trimester: 28-40 weeks. The day when your baby is ready to be born (full term) is your estimated date of delivery. How does my baby develop month by month? First month  The fertilized egg attaches to the inside of the uterus.  Some cells will form the placenta.  Others will form the fetus.  The arms, legs, brain, spinal cord, lungs, and heart begin to develop.  At the end of the first month, the heart begins to beat. Second month  The bones, inner ear, eyelids, hands, and feet form.  The genitals develop.  By the end of 8 weeks, all major organs are developing. Third month  All of the internal organs are forming.  Teeth develop below the gums.  Bones and muscles begin to grow. The spine can flex.  The skin is transparent.  Fingernails and toenails begin to form.  Arms and legs continue to grow longer, and hands and feet develop.  The fetus is about 3 inches (7.6 cm) long. Fourth month  The placenta is completely formed.  The external sex organs, neck, outer ear, eyebrows, eyelids, and fingernails are formed.  The fetus can hear, swallow, and move its arms and legs.  The kidneys begin to produce urine.  The skin is covered with a white, waxy coating (vernix) and very fine hair (lanugo). Fifth month  The fetus moves around more and can be felt for the first time (quickening).  The fetus starts to sleep and wake up and may begin to suck its finger.  The nails grow to the end of the fingers.  The organ in the digestive system that makes bile (gallbladder) functions and helps to digest nutrients.  If your baby is a girl, eggs are present in her ovaries. If your baby is a boy, testicles start to move down into his scrotum. Sixth month  The lungs are formed.  The eyes open. The brain continues to develop.  Your baby has fingerprints and toe prints. Your baby's hair grows thicker.  At the end of the second trimester, the fetus is about 9 inches (22.9 cm) long. Seventh month  The fetus kicks and stretches.  The eyes are developed enough to sense changes in light.  The hands can make a grasping motion.  The fetus responds to sound. Eighth month  All organs and body systems are fully developed and  functioning.  Bones harden, and taste buds develop. The fetus may hiccup.  Certain areas of the brain are still developing. The skull remains soft. Ninth month  The fetus gains about  lb (0.23 kg) each week.  The lungs are fully developed.  Patterns of sleep develop.  The fetus's head typically moves into a head-down position (vertex) in the uterus to prepare for birth.  The fetus weighs  6-9 lb (2.72-4.08 kg) and is 19-20 inches (48.26-50.8 cm) long. What can I do to have a healthy pregnancy and help my baby develop? General instructions  Take prenatal vitamins as directed by your health care provider. These include vitamins such as folic acid, iron, calcium, and vitamin D. They are important for healthy development.  Take medicines only as directed by your health care provider. Read labels and ask a pharmacist or your health care provider whether over-the-counter medicines, supplements, and prescription drugs are safe to take during pregnancy.  Keep all follow-up visits as directed by your health care provider. This is important. Follow-up visits include prenatal care and screening tests. How do I know if my baby is developing well? At each prenatal visit, your health care provider will do several different tests to check on your health and keep track of your baby's development. These include:  Fundal height and position. ? Your health care provider will measure your growing belly from your pubic bone to the top of the uterus using a tape measure. ? Your health care provider will also feel your belly to determine your baby's position.  Heartbeat. ? An ultrasound in the first trimester can confirm pregnancy and show a heartbeat, depending on how far along you are. ? Your health care provider will check your baby's heart rate at every prenatal visit.  Second trimester ultrasound. ? This ultrasound checks your baby's development. It also may show your baby's gender. What should I  do if I have concerns about my baby's development? Always talk with your health care provider about any concerns that you may have about your pregnancy and your baby. Summary  A pregnancy usually lasts 280 days, or about 40 weeks. Pregnancy is divided into three periods of growth, also called trimesters.  Your health care provider will monitor your baby's growth and development throughout your pregnancy.  Follow your health care provider's recommendations about taking prenatal vitamins and medicines during your pregnancy.  Talk with your health care provider if you have any concerns about your pregnancy or your developing baby. This information is not intended to replace advice given to you by your health care provider. Make sure you discuss any questions you have with your health care provider. Document Released: 12/29/2007 Document Revised: 11/02/2018 Document Reviewed: 05/25/2017 Elsevier Patient Education  2020 [place] of Pregnancy  The first trimester of pregnancy is from week 1 until the end of week 13 (months 1 through 3). During this time, your baby will begin to develop inside you. At 6-8 weeks, the eyes and face are formed, and the heartbeat can be seen on ultrasound. At the end of 12 weeks, all the baby's organs are formed. Prenatal care is all the medical care you receive before the birth of your baby. Make sure you get good prenatal care and follow all of your doctor's instructions. Follow these instructions at home: Medicines  Take over-the-counter and prescription medicines only as told by your doctor. Some medicines are safe and some medicines are not safe during pregnancy.  Take a prenatal vitamin that contains at least 600 micrograms (mcg) of folic acid.  If you have trouble pooping (constipation), take medicine that will make your stool soft (stool softener) if your doctor approves. Eating and drinking   Eat regular, healthy meals.  Your doctor  will tell you the amount of weight gain that is right for you.  Avoid raw meat and uncooked cheese.  If you feel sick to  your stomach (nauseous) or throw up (vomit): ? Eat 4 or 5 small meals a day instead of 3 large meals. ? Try eating a few soda crackers. ? Drink liquids between meals instead of during meals.  To prevent constipation: ? Eat foods that are high in fiber, like fresh fruits and vegetables, whole grains, and beans. ? Drink enough fluids to keep your pee (urine) clear or pale yellow. Activity  Exercise only as told by your doctor. Stop exercising if you have cramps or pain in your lower belly (abdomen) or low back.  Do not exercise if it is too hot, too humid, or if you are in a place of great height (high altitude).  Try to avoid standing for long periods of time. Move your legs often if you must stand in one place for a long time.  Avoid heavy lifting.  Wear low-heeled shoes. Sit and stand up straight.  You can have sex unless your doctor tells you not to. Relieving pain and discomfort  Wear a good support bra if your breasts are sore.  Take warm water baths (sitz baths) to soothe pain or discomfort caused by hemorrhoids. Use hemorrhoid cream if your doctor says it is okay.  Rest with your legs raised if you have leg cramps or low back pain.  If you have puffy, bulging veins (varicose veins) in your legs: ? Wear support hose or compression stockings as told by your doctor. ? Raise (elevate) your feet for 15 minutes, 3-4 times a day. ? Limit salt in your food. Prenatal care  Schedule your prenatal visits by the twelfth week of pregnancy.  Write down your questions. Take them to your prenatal visits.  Keep all your prenatal visits as told by your doctor. This is important. Safety  Wear your seat belt at all times when driving.  Make a list of emergency phone numbers. The list should include numbers for family, friends, the hospital, and police and fire  departments. General instructions  Ask your doctor for a referral to a local prenatal class. Begin classes no later than at the start of month 6 of your pregnancy.  Ask for help if you need counseling or if you need help with nutrition. Your doctor can give you advice or tell you where to go for help.  Do not use hot tubs, steam rooms, or saunas.  Do not douche or use tampons or scented sanitary pads.  Do not cross your legs for long periods of time.  Avoid all herbs and alcohol. Avoid drugs that are not approved by your doctor.  Do not use any tobacco products, including cigarettes, chewing tobacco, and electronic cigarettes. If you need help quitting, ask your doctor. You may get counseling or other support to help you quit.  Avoid cat litter boxes and soil used by cats. These carry germs that can cause birth defects in the baby and can cause a loss of your baby (miscarriage) or stillbirth.  Visit your dentist. At home, brush your teeth with a soft toothbrush. Be gentle when you floss. Contact a doctor if:  You are dizzy.  You have mild cramps or pressure in your lower belly.  You have a nagging pain in your belly area.  You continue to feel sick to your stomach, you throw up, or you have watery poop (diarrhea).  You have a bad smelling fluid coming from your vagina.  You have pain when you pee (urinate).  You have increased puffiness (swelling) in your  face, hands, legs, or ankles. Get help right away if:  You have a fever.  You are leaking fluid from your vagina.  You have spotting or bleeding from your vagina.  You have very bad belly cramping or pain.  You gain or lose weight rapidly.  You throw up blood. It may look like coffee grounds.  You are around people who have [place] measles, fifth disease, or chickenpox.  You have a very bad headache.  You have shortness of breath.  You have any kind of trauma, such as from a fall or a car  accident. Summary  The first trimester of pregnancy is from week 1 until the end of week 13 (months 1 through 3).  To take care of yourself and your unborn baby, you will need to eat healthy meals, take medicines only if your doctor tells you to do so, and do activities that are safe for you and your baby.  Keep all follow-up visits as told by your doctor. This is important as your doctor will have to ensure that your baby is healthy and growing well. This information is not intended to replace advice given to you by your health care provider. Make sure you discuss any questions you have with your health care provider. Document Released: 12/29/2007 Document Revised: 11/02/2018 Document Reviewed: 07/20/2016 Elsevier Patient Education  2020 [employer]. Commonly Asked Questions During Pregnancy  Cats: A parasite can be excreted in cat feces.  To avoid exposure you need to have another person empty the little box.  If you must empty the litter box you will need to wear gloves.  Wash your hands after handling your cat.  This parasite can also be found in raw or undercooked meat so this should also be avoided.  Colds, Sore Throats, Flu: Please check your medication sheet to see what you can take for symptoms.  If your symptoms are unrelieved by these medications please call the office.  Dental Work: Most any dental work [employer] recommends is permitted.  X-rays should only be taken during the first trimester if absolutely necessary.  Your abdomen should be shielded with a lead apron during all x-rays.  Please notify your provider prior to receiving any x-rays.  Novocaine is fine; gas is not recommended.  If your dentist requires a note from [place] prior to dental work please call the office and we will provide one for you.  Exercise: Exercise is an important part of staying healthy during your pregnancy.  You may continue most exercises you were accustomed to prior to pregnancy.  Later in your  pregnancy you will most likely notice you have difficulty with activities requiring balance like riding a bicycle.  It is important that you listen to your body and avoid activities that put you at a higher risk of falling.  Adequate rest and staying well hydrated are a must!  If you have questions about the safety of specific activities ask your provider.    Exposure to Children with illness: Try to avoid obvious exposure; report any symptoms to [place] when noted,  If you have chicken pos, red measles or mumps, you should be immune to these diseases.   Please do not take any vaccines while pregnant unless you have checked with your OB provider.  Fetal Movement: After 28 weeks we recommend you do "kick counts" twice daily.  Lie or sit down in a calm quiet environment and count your baby movements "kicks".  You should feel your baby  should feel your baby at least 10 times per hour.  If you have not felt 10 kicks within the first hour get up, walk around and have something sweet to eat or drink then repeat for an additional hour.  If count remains less than 10 per hour notify your provider.  Fumigating: Follow your pest control agent's advice as to how long to stay out of your home.  Ventilate the area well before re-entering.  Hemorrhoids:   Most over-the-counter preparations can be used during pregnancy.  Check your medication to see what is safe to use.  It is important to use a stool softener or fiber in your diet and to drink lots of liquids.  If hemorrhoids seem to be getting worse please call the office.   Hot Tubs:  Hot tubs Jacuzzis and saunas are not recommended while pregnant.  These increase your internal body temperature and should be avoided.  Intercourse:  Sexual intercourse is safe during pregnancy as long as you are comfortable, unless otherwise advised by your provider.  Spotting may occur after intercourse; report any bright red bleeding that is heavier than spotting.  Labor:  If you know that you are in labor,  please go to the hospital.  If you are unsure, please call the office and let us help you decide what to do.  Lifting, straining, etc:  If your job requires heavy lifting or straining please check with your provider for any limitations.  Generally, you should not lift items heavier than that you can lift simply with your hands and arms (no back muscles)  Painting:  Paint fumes do not harm your pregnancy, but may make you ill and should be avoided if possible.  Latex or water based paints have less odor than oils.  Use adequate ventilation while painting.  Permanents & Hair Color:  Chemicals in hair dyes are not recommended as they cause increase hair dryness which can increase hair loss during pregnancy.  " Highlighting" and permanents are allowed.  Dye may be absorbed differently and permanents may not hold as well during pregnancy.  Sunbathing:  Use a sunscreen, as skin burns easily during pregnancy.  Drink plenty of fluids; avoid over heating.  Tanning Beds:  Because their possible side effects are still unknown, tanning beds are not recommended.  Ultrasound Scans:  Routine ultrasounds are performed at approximately 20 weeks.  You will be able to see your baby's general anatomy an if you would like to know the gender this can usually be determined as well.  If it is questionable when you conceived you may also receive an ultrasound early in your pregnancy for dating purposes.  Otherwise ultrasound exams are not routinely performed unless there is a medical necessity.  Although you can request a scan we ask that you pay for it when conducted because insurance does not cover " patient request" scans.  Work: If your pregnancy proceeds without complications you may work until your due date, unless your physician or employer advises otherwise.  Round Ligament Pain/Pelvic Discomfort:  Sharp, shooting pains not associated with bleeding are fairly common, usually occurring in the second trimester of  pregnancy.  They tend to be worse when standing up or when you remain standing for long periods of time.  These are the result of pressure of certain pelvic ligaments called "round ligaments".  Rest, Tylenol and heat seem to be the most effective relief.  As the womb and fetus grow, they rise out of the pelvis   Please notify the office if your pain seems different than that described.  It may represent a more serious condition.  Common Medications Safe in Pregnancy  Acne:      Constipation:  Benzoyl Peroxide     Colace  Clindamycin      Dulcolax Suppository  Topica Erythromycin     Fibercon  Salicylic Acid      Metamucil         Miralax AVOID:        Senakot   Accutane    Cough:  Retin-A       Cough Drops  Tetracycline      Phenergan w/ Codeine if Rx  Minocycline      Robitussin (Plain & DM)  Antibiotics:     Crabs/Lice:  Ceclor       RID  Cephalosporins    AVOID:  E-Mycins      Kwell  Keflex  Macrobid/Macrodantin   Diarrhea:  Penicillin      Kao-Pectate  Zithromax      Imodium AD         PUSH FLUIDS AVOID:       Cipro     Fever:  Tetracycline      Tylenol (Regular or Extra  Minocycline       Strength)  Levaquin      Extra Strength-Do not          Exceed 8 tabs/24 hrs Caffeine:        <258m/day (equiv. To 1 cup of coffee or  approx. 3 12 oz sodas)         Gas: Cold/Hayfever:       Gas-X  Benadryl      Mylicon  Claritin       Phazyme  **Claritin-D        Chlor-Trimeton    Headaches:  Dimetapp      ASA-Free Excedrin  Drixoral-Non-Drowsy     Cold Compress  Mucinex (Guaifenasin)     Tylenol (Regular or Extra  Sudafed/Sudafed-12 Hour     Strength)  **Sudafed PE Pseudoephedrine   Tylenol Cold & Sinus     Vicks Vapor Rub  Zyrtec  **AVOID if Problems With Blood Pressure         Heartburn: Avoid lying down for at least 1 hour after meals  Aciphex      Maalox     Rash:  Milk of Magnesia     Benadryl    Mylanta       1% Hydrocortisone  Cream  Pepcid  Pepcid Complete   Sleep Aids:  Prevacid      Ambien   Prilosec       Benadryl  Rolaids       Chamomile Tea  Tums (Limit 4/day)     Unisom  Zantac       Tylenol PM         Warm milk-add vanilla or  Hemorrhoids:       Sugar for taste  Anusol/Anusol H.C.  (RX: Analapram 2.5%)  Sugar Substitutes:  Hydrocortisone OTC     Ok in moderation  Preparation H      Tucks        Vaseline lotion applied to tissue with wiping    Herpes:     Throat:  Acyclovir      Oragel  Famvir  Valtrex     Vaccines:         Flu Shot Leg Cramps:       *Gardasil  Benadryl      Hepatitis A         Hepatitis B Nasal Spray:       Pneumovax  Saline Nasal Spray     Polio Booster         Tetanus Nausea:       Tuberculosis test or PPD  Vitamin B6 25 mg TID   AVOID:    Dramamine      *Gardasil  Emetrol       Live Poliovirus  Ginger Root 250 mg QID    MMR (measles, mumps &  High Complex Carbs @ Bedtime    rebella)  Sea Bands-Accupressure    Varicella (Chickenpox)  Unisom 1/2 tab TID     *No known complications           If received before Pain:         Known pregnancy;   Darvocet       Resume series after  Lortab        Delivery  Percocet    Yeast:   Tramadol      Femstat  Tylenol 3      Gyne-lotrimin  Ultram       Monistat  Vicodin           MISC:         All Sunscreens           Hair Coloring/highlights          Insect Repellant's          (Including DEET)         Mystic Tans

## 2019-05-10 NOTE — Progress Notes (Signed)
       Tracy Patel presents for NOB nurse intake visit. Pregnancy confirmation done at [facility],04/24/19, with [name].  G 3.  P 1011.  LMP 02/28/19.  EDD 12/01/2019.  Ga [redacted]w[redacted]d. Pregnancy education material explained and given. 0 cats in the home.  NOB labs ordered. BMI less than 30. TSH/HbgA1c not ordered. Sickle cell order due to race. HIV and drug screen explained and ordered/declined. Genetic screening discussed. Genetic testing; Ordered/Declined/Unsure. Pt to discuss genetic testing with provider. PNV encouraged. Pt to follow up with provider in 2 weeks for NOB physical. [place] and FMLA forms reviewed and signed by pt.    BP 102/69   Pulse 79   Ht 5\' 4"  (1.626 m)   Wt 169 lb 6.4 oz (76.8 kg)   LMP 02/28/2019 (Exact Date)   BMI 29.08 kg/m

## 2019-05-11 LAB — URINALYSIS, ROUTINE W REFLEX MICROSCOPIC
Bilirubin, UA: NEGATIVE
Glucose, UA: NEGATIVE
Ketones, UA: NEGATIVE
Nitrite, UA: NEGATIVE
Protein,UA: NEGATIVE
RBC, UA: NEGATIVE
Specific Gravity, UA: 1.021 (ref 1.005–1.030)
Urobilinogen, Ur: 0.2 mg/dL (ref 0.2–1.0)
pH, UA: 6 (ref 5.0–7.5)

## 2019-05-11 LAB — HGB SOLU + RFLX FRAC: Sickle Solubility Test - HGBRFX: NEGATIVE

## 2019-05-11 LAB — RPR: RPR Ser Ql: NONREACTIVE

## 2019-05-11 LAB — HEPATITIS B SURFACE ANTIGEN: Hepatitis B Surface Ag: NEGATIVE

## 2019-05-11 LAB — DRUG PROFILE, UR, 9 DRUGS (LABCORP)
Amphetamines, Urine: NEGATIVE ng/mL
Barbiturate Quant, Ur: NEGATIVE ng/mL
Benzodiazepine Quant, Ur: NEGATIVE ng/mL
Cannabinoid Quant, Ur: NEGATIVE ng/mL
Cocaine (Metab.): NEGATIVE ng/mL
Methadone Screen, Urine: NEGATIVE ng/mL
Opiate Quant, Ur: NEGATIVE ng/mL
PCP Quant, Ur: NEGATIVE ng/mL
Propoxyphene: NEGATIVE ng/mL

## 2019-05-11 LAB — VARICELLA ZOSTER ANTIBODY, IGG: Varicella zoster IgG: >4000 index (ref >165)

## 2019-05-11 LAB — MICROSCOPIC EXAMINATION
Casts: NONE SEEN /lpf
Epithelial Cells (non renal): >10 /hpf — AB (ref 0–10)
RBC, Urine: NONE SEEN /hpf (ref 0–2)
WBC, UA: >30 /hpf — AB (ref 0–5)

## 2019-05-11 LAB — ABO AND RH: Rh Factor: POSITIVE

## 2019-05-11 LAB — RUBELLA SCREEN: Rubella Antibodies, IGG: 8.75 index (ref >0.99)

## 2019-05-11 LAB — NICOTINE SCREEN, URINE: Cotinine Ql Scrn, Ur: NEGATIVE ng/mL

## 2019-05-11 LAB — HIV ANTIBODY (ROUTINE TESTING W REFLEX): HIV Screen 4th Generation wRfx: NONREACTIVE

## 2019-05-12 LAB — GC/CHLAMYDIA PROBE AMP
Chlamydia trachomatis, NAA: NEGATIVE
Neisseria Gonorrhoeae by PCR: NEGATIVE

## 2019-05-12 LAB — URINE CULTURE, OB REFLEX

## 2019-05-12 LAB — CULTURE, OB URINE

## 2019-05-14 ENCOUNTER — Other Ambulatory Visit: Payer: Self-pay | Admitting: Certified Nurse Midwife

## 2019-05-14 MED ORDER — NITROFURANTOIN MONOHYD MACRO 100 MG PO CAPS: 100.0000 mg | ORAL_CAPSULE | Freq: Two times a day (BID) | ORAL | 0 refills | Status: AC

## 2019-05-14 NOTE — Progress Notes (Signed)
Urine growth 50,000-100,000 , orders placed for treatment.   [name], CNM

## 2019-05-23 ENCOUNTER — Other Ambulatory Visit: Payer: Self-pay

## 2019-05-23 ENCOUNTER — Other Ambulatory Visit (HOSPITAL_COMMUNITY)
Admission: RE | Admit: 2019-05-23 | Discharge: 2019-05-23 | Disposition: A | Discharge status: Home / Self Care | Payer: Medicaid Other | Source: Ambulatory Visit | Attending: Certified Nurse Midwife | Admitting: Certified Nurse Midwife

## 2019-05-23 ENCOUNTER — Ambulatory Visit (INDEPENDENT_AMBULATORY_CARE_PROVIDER_SITE_OTHER): Payer: Medicaid Other | Admitting: Certified Nurse Midwife

## 2019-05-23 ENCOUNTER — Encounter: Payer: Self-pay | Admitting: Certified Nurse Midwife

## 2019-05-23 VITALS — BP 114/73 | HR 84 | Wt 174.6 lb

## 2019-05-23 DIAGNOSIS — Z3481 Encounter for supervision of other normal pregnancy, first trimester: Secondary | ICD-10-CM | POA: Insufficient documentation

## 2019-05-23 DIAGNOSIS — Z3A12 12 weeks gestation of pregnancy: Secondary | ICD-10-CM

## 2019-05-23 LAB — POCT URINALYSIS DIPSTICK OB
Bilirubin, UA: NEGATIVE
Blood, UA: NEGATIVE
Glucose, UA: NEGATIVE
Ketones, UA: NEGATIVE
Leukocytes, UA: NEGATIVE
Nitrite, UA: NEGATIVE
POC,PROTEIN,UA: NEGATIVE
Spec Grav, UA: >=1.03 — AB (ref 1.010–1.025)
Urobilinogen, UA: 0.2 E.U./dL
pH, UA: 5 (ref 5.0–8.0)

## 2019-05-23 MED ORDER — BUTALBITAL-APAP-CAFFEINE 50-325-40 MG PO TABS: 1.0000 | ORAL_TABLET | Freq: Four times a day (QID) | ORAL | 0 refills | Status: DC | PRN
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
## 2019-05-23 NOTE — Patient Instructions (Signed)

## 2019-05-25 ENCOUNTER — Encounter: Payer: Medicaid Other | Admitting: Certified Nurse Midwife

## 2019-05-25 LAB — CULTURE, OB URINE

## 2019-05-25 LAB — URINE CULTURE, OB REFLEX

## 2019-06-01 LAB — CYTOLOGY - PAP
Comment: NEGATIVE
Comment: NEGATIVE
Diagnosis: HIGH — AB
HPV 16: NEGATIVE
HPV 18 / 45: NEGATIVE
High risk HPV: POSITIVE — AB

## 2019-06-20 ENCOUNTER — Ambulatory Visit (INDEPENDENT_AMBULATORY_CARE_PROVIDER_SITE_OTHER): Payer: Medicaid Other | Admitting: Certified Nurse Midwife

## 2019-06-20 ENCOUNTER — Encounter: Payer: Medicaid Other | Admitting: Obstetrics and Gynecology

## 2019-06-20 ENCOUNTER — Encounter: Payer: Self-pay | Admitting: Certified Nurse Midwife

## 2019-06-20 ENCOUNTER — Other Ambulatory Visit: Payer: Self-pay

## 2019-06-20 VITALS — BP 100/88 | HR 91 | Wt 181.3 lb

## 2019-06-20 DIAGNOSIS — Z3482 Encounter for supervision of other normal pregnancy, second trimester: Secondary | ICD-10-CM

## 2019-06-20 NOTE — Patient Instructions (Signed)

## 2019-06-20 NOTE — Progress Notes (Signed)
ROB doing well. Feeling movement. Reveiwed round ligament pain. Discussed pap smear results. Pt state she appointment with Dr. Marcelline Mates on 12/9 for colposcopy. Information given on HPV /HSIL. Discussed anatomy u/s @ 20 wks. Follow up 24 wks.   [name], CNM

## 2019-07-03 ENCOUNTER — Encounter: Payer: Medicaid Other | Admitting: Obstetrics and Gynecology

## 2019-07-17 ENCOUNTER — Encounter: Payer: Medicaid Other | Admitting: Certified Nurse Midwife

## 2019-07-17 ENCOUNTER — Ambulatory Visit (INDEPENDENT_AMBULATORY_CARE_PROVIDER_SITE_OTHER): Payer: Medicaid Other

## 2019-07-17 ENCOUNTER — Ambulatory Visit (INDEPENDENT_AMBULATORY_CARE_PROVIDER_SITE_OTHER): Payer: Medicaid Other | Admitting: Certified Nurse Midwife

## 2019-07-17 ENCOUNTER — Other Ambulatory Visit: Payer: Self-pay | Admitting: Certified Nurse Midwife

## 2019-07-17 ENCOUNTER — Other Ambulatory Visit: Payer: Self-pay

## 2019-07-17 VITALS — BP 106/67 | HR 86 | Wt 185.5 lb

## 2019-07-17 DIAGNOSIS — Z3482 Encounter for supervision of other normal pregnancy, second trimester: Secondary | ICD-10-CM

## 2019-07-17 DIAGNOSIS — Z3492 Encounter for supervision of normal pregnancy, unspecified, second trimester: Secondary | ICD-10-CM

## 2019-07-17 DIAGNOSIS — Z3A20 20 weeks gestation of pregnancy: Secondary | ICD-10-CM

## 2019-07-17 LAB — POCT URINALYSIS DIPSTICK OB
Bilirubin, UA: NEGATIVE
Blood, UA: NEGATIVE
Glucose, UA: NEGATIVE
Ketones, UA: NEGATIVE
Leukocytes, UA: NEGATIVE
Nitrite, UA: NEGATIVE
POC,PROTEIN,UA: NEGATIVE
Spec Grav, UA: 1.015 (ref 1.010–1.025)
Urobilinogen, UA: 0.2 E.U./dL
pH, UA: 6 (ref 5.0–8.0)

## 2019-07-17 NOTE — Progress Notes (Signed)
ROB , doing well. Feels movement. U/s reviewed. (complete-see below). Follow up 4 wks.   [name], CNM   Patient Name: Tracy Patel DOB: Jan 09, 1986 MRN: [id number] ULTRASOUND REPORT  Location: [facility] Date of Service: 07/17/2019   Indications:Anatomy Ultrasound Findings:  [name] intrauterine pregnancy is visualized with FHR at 158 BPM. Biometrics give an (U/S) Gestational age of [redacted]w[redacted]d and an (U/S) EDD of 11/30/2019; this correlates with the clinically established Estimated Date of Delivery: 12/01/19  Fetal presentation is Variable.  EFW: 349 g ( 12 oz). Fetal Percentile 65% Placenta: posterior. Grade: 1 AFI: subjectively normal.  Anatomic survey is complete and normal; Gender - female.    Right Ovary is normal in appearance. Left Ovary is normal appearance. Survey of the adnexa demonstrates no adnexal masses. There is no free peritoneal fluid in the cul de sac.  Impression: 1. [redacted]w[redacted]d Viable Singleton Intrauterine pregnancy by U/S. 2. (U/S) EDD is consistent with Clinically established Estimated Date of Delivery: 12/01/19 . 3. Normal Anatomy Scan  Recommendations: 1.Clinical correlation with the patient's History and Physical Exam.   Jenine M. Albertine Grates    RDMS

## 2019-07-17 NOTE — Patient Instructions (Signed)

## 2019-07-18 ENCOUNTER — Ambulatory Visit (INDEPENDENT_AMBULATORY_CARE_PROVIDER_SITE_OTHER): Payer: Medicaid Other | Admitting: Obstetrics and Gynecology

## 2019-07-18 ENCOUNTER — Encounter: Payer: Self-pay | Admitting: Obstetrics and Gynecology

## 2019-07-18 VITALS — BP 105/72 | HR 79 | Ht 64.0 in | Wt 185.5 lb

## 2019-07-18 DIAGNOSIS — R87613 High grade squamous intraepithelial lesion on cytologic smear of cervix (HGSIL): Secondary | ICD-10-CM
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
## 2019-07-27 NOTE — L&D Delivery Note (Signed)
       Delivery Note   MARIALENA WOLLEN is a 34 y.o. G3P1011 at [redacted]w[redacted]d Estimated Date of Delivery: 12/01/19  PRE-OPERATIVE DIAGNOSIS:  1) [redacted]w[redacted]d pregnancy.   POST-OPERATIVE DIAGNOSIS:  1) [redacted]w[redacted]d pregnancy s/p Vaginal, Spontaneous   Delivery Type: Vaginal, Spontaneous    Delivery Anesthesia: None   Labor Complications:   none   ESTIMATED BLOOD LOSS: 350 ml    FINDINGS:   1) female infant, Apgar scores of 8   at 1 minute and 9   at 5 minutes and a birth weight is pending , infant remains skin to skin   2) Nuchal cord: no  SPECIMENS:   PLACENTA:   Appearance: Intact , 3 vessel cord   Removal: Spontaneous      Disposition:  per protocol   DISPOSITION:  Infant to left in stable condition in the delivery room, with L&D personnel and mother,  NARRATIVE SUMMARY: Labor course:  Ms. DRINA JOBST is a [id number] at [redacted]w[redacted]d who presented for labor management.  She progressed well in labor without pitocin.  She received the appropriate anesthesia and proceeded to complete dilation. She evidenced good maternal expulsive effort during the second stage. She went on to deliver a viable female infant "Issac" in LOT position. The placenta delivered without problems and was noted to be complete. A perineal and vaginal examination was performed. Episiotomy/Lacerations: 1st degree 1st degree ,was repaired with Vicryl 3-0 Rapide suture using local anesthesia. The patient tolerated this well.  Doreene Burke, CNM  12/03/2019 12:21 PM

## 2019-08-01 ENCOUNTER — Encounter: Payer: Self-pay | Admitting: Obstetrics and Gynecology

## 2019-08-01 ENCOUNTER — Ambulatory Visit (INDEPENDENT_AMBULATORY_CARE_PROVIDER_SITE_OTHER): Payer: Medicaid Other | Admitting: Obstetrics and Gynecology

## 2019-08-01 ENCOUNTER — Other Ambulatory Visit: Payer: Self-pay

## 2019-08-01 VITALS — BP 111/73 | HR 64 | Ht 64.0 in | Wt 189.5 lb

## 2019-08-01 DIAGNOSIS — B977 Papillomavirus as the cause of diseases classified elsewhere: Secondary | ICD-10-CM | POA: Diagnosis not present

## 2019-08-01 DIAGNOSIS — N72 Inflammatory disease of cervix uteri: Secondary | ICD-10-CM

## 2019-08-01 DIAGNOSIS — R87613 High grade squamous intraepithelial lesion on cytologic smear of cervix (HGSIL): Secondary | ICD-10-CM
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
## 2019-08-17 ENCOUNTER — Encounter: Payer: Self-pay | Admitting: Certified Nurse Midwife

## 2019-08-17 ENCOUNTER — Other Ambulatory Visit: Payer: Self-pay

## 2019-08-17 ENCOUNTER — Encounter: Payer: Medicaid Other | Admitting: Certified Nurse Midwife

## 2019-08-17 ENCOUNTER — Ambulatory Visit (INDEPENDENT_AMBULATORY_CARE_PROVIDER_SITE_OTHER): Payer: Medicaid Other | Admitting: Certified Nurse Midwife

## 2019-08-17 VITALS — BP 109/62 | HR 69 | Wt 191.1 lb

## 2019-08-17 DIAGNOSIS — Z3A25 25 weeks gestation of pregnancy: Secondary | ICD-10-CM

## 2019-08-17 DIAGNOSIS — Z3482 Encounter for supervision of other normal pregnancy, second trimester: Secondary | ICD-10-CM

## 2019-08-17 LAB — POCT URINALYSIS DIPSTICK OB
Bilirubin, UA: NEGATIVE
Blood, UA: NEGATIVE
Glucose, UA: NEGATIVE
Ketones, UA: NEGATIVE
Leukocytes, UA: NEGATIVE
Nitrite, UA: NEGATIVE
POC,PROTEIN,UA: NEGATIVE
Spec Grav, UA: 1.015 (ref 1.010–1.025)
Urobilinogen, UA: 0.2 E.U./dL
pH, UA: 5 (ref 5.0–8.0)

## 2019-08-17 NOTE — Progress Notes (Signed)
ROB doing well. Feels good movement. Asks about covid vaccine. Reveiewd recommendations. She verbalizes understanding. Discussed 28 wk visit. Follow up 4 wks.   Doreene Burke, CNM

## 2019-08-17 NOTE — Patient Instructions (Signed)
Glucose Tolerance Test During Pregnancy Why am I having this test? The glucose tolerance test (GTT) is done to check how your body processes sugar (glucose). This is one of several tests used to diagnose diabetes that develops during pregnancy (gestational diabetes mellitus). Gestational diabetes is a temporary form of diabetes that some women develop during pregnancy. It usually occurs during the second trimester of pregnancy and goes away after delivery. Testing (screening) for gestational diabetes usually occurs between 24 and 28 weeks of pregnancy. You may have the GTT test after having a 1-hour glucose screening test if the results from that test indicate that you may have gestational diabetes. You may also have this test if:  You have a history of gestational diabetes.  You have a history of giving birth to very large babies or have experienced repeated fetal loss (stillbirth).  You have signs and symptoms of diabetes, such as: ? Changes in your vision. ? Tingling or numbness in your hands or feet. ? Changes in hunger, thirst, and urination that are not otherwise explained by your pregnancy. What is being tested? This test measures the amount of glucose in your blood at different times during a period of 3 hours. This indicates how well your body is able to process glucose. What kind of sample is taken?  Blood samples are required for this test. They are usually collected by inserting a needle into a blood vessel. How do I prepare for this test?  For 3 days before your test, eat normally. Have plenty of carbohydrate-rich foods.  Follow instructions from your health care provider about: ? Eating or drinking restrictions on the day of the test. You may be asked to not eat or drink anything other than water (fast) starting 8-10 hours before the test. ? Changing or stopping your regular medicines. Some medicines may interfere with this test. Tell a health care provider about:  All  medicines you are taking, including vitamins, herbs, eye drops, creams, and over-the-counter medicines.  Any blood disorders you have.  Any surgeries you have had.  Any medical conditions you have. What happens during the test? First, your blood glucose will be measured. This is referred to as your fasting blood glucose, since you fasted before the test. Then, you will drink a glucose solution that contains a certain amount of glucose. Your blood glucose will be measured again 1, 2, and 3 hours after drinking the solution. This test takes about 3 hours to complete. You will need to stay at the testing location during this time. During the testing period:  Do not eat or drink anything other than the glucose solution.  Do not exercise.  Do not use any products that contain nicotine or tobacco, such as cigarettes and e-cigarettes. If you need help stopping, ask your health care provider. The testing procedure may vary among health care providers and hospitals. How are the results reported? Your results will be reported as milligrams of glucose per deciliter of blood (mg/dL) or millimoles per liter (mmol/L). Your health care provider will compare your results to normal ranges that were established after testing a large group of people (reference ranges). Reference ranges may vary among labs and hospitals. For this test, common reference ranges are:  Fasting: less than 95-105 mg/dL (5.3-5.8 mmol/L).  1 hour after drinking glucose: less than 180-190 mg/dL (10.0-10.5 mmol/L).  2 hours after drinking glucose: less than 155-165 mg/dL (8.6-9.2 mmol/L).  3 hours after drinking glucose: 140-145 mg/dL (7.8-8.1 mmol/L). What do the   results mean? Results within reference ranges are considered normal, meaning that your glucose levels are well-controlled. If two or more of your blood glucose levels are high, you may be diagnosed with gestational diabetes. If only one level is high, your health care  provider may suggest repeat testing or other tests to confirm a diagnosis. Talk with your health care provider about what your results mean. Questions to ask your health care provider Ask your health care provider, or the department that is doing the test:  When will my results be ready?  How will I get my results?  What are my treatment options?  What other tests do I need?  What are my next steps? Summary  The glucose tolerance test (GTT) is one of several tests used to diagnose diabetes that develops during pregnancy (gestational diabetes mellitus). Gestational diabetes is a temporary form of diabetes that some women develop during pregnancy.  You may have the GTT test after having a 1-hour glucose screening test if the results from that test indicate that you may have gestational diabetes. You may also have this test if you have any symptoms or risk factors for gestational diabetes.  Talk with your health care provider about what your results mean. This information is not intended to replace advice given to you by your health care provider. Make sure you discuss any questions you have with your health care provider. Document Revised: 11/02/2018 Document Reviewed: 02/21/2017 Elsevier Patient Education  2020 [employer]  

## 2019-09-14 ENCOUNTER — Ambulatory Visit (INDEPENDENT_AMBULATORY_CARE_PROVIDER_SITE_OTHER): Payer: Medicaid Other | Admitting: Certified Nurse Midwife

## 2019-09-14 ENCOUNTER — Other Ambulatory Visit: Payer: Self-pay

## 2019-09-14 ENCOUNTER — Other Ambulatory Visit: Payer: Medicaid Other

## 2019-09-14 VITALS — BP 96/63 | HR 92 | Wt 198.7 lb

## 2019-09-14 DIAGNOSIS — Z113 Encounter for screening for infections with a predominantly sexual mode of transmission: Secondary | ICD-10-CM

## 2019-09-14 DIAGNOSIS — Z130 Encounter for screening for diseases of the blood and blood-forming organs and certain disorders involving the immune mechanism: Secondary | ICD-10-CM

## 2019-09-14 DIAGNOSIS — Z131 Encounter for screening for diabetes mellitus: Secondary | ICD-10-CM

## 2019-09-14 DIAGNOSIS — Z3493 Encounter for supervision of normal pregnancy, unspecified, third trimester: Secondary | ICD-10-CM

## 2019-09-14 MED ORDER — TETANUS-DIPHTH-ACELL PERTUSSIS 5-2.5-18.5 LF-MCG/0.5 IM SUSP
0.5000 mL | Freq: Once | INTRAMUSCULAR | Status: AC
  Administered 2019-09-14: 0.5 mL via INTRAMUSCULAR

## 2019-09-14 NOTE — Progress Notes (Signed)
ROB-Doing well, no questions or concerns. 28 wk labs today, see orders. Blood transfusion consent reviewed and signed. TDaP given. Third trimester handouts provided including Soil scientist and [facility] Pamphlets. Desires NCB and breastfeeding. Plans circumcision. RSB discussed. Encouraged childbirth and breastfeeding classes. Anticipatory guidance regarding course of prenatal care. Reviewed red flag symptoms and when to call. RTC x 2 weeks for ROB or sooner if needed.

## 2019-09-14 NOTE — Patient Instructions (Addendum)
Pain Relief During Labor and Delivery Many things can cause pain during labor and delivery, including:  Pressure on bones and ligaments due to the baby moving through the pelvis.  Stretching of tissues due to the baby moving through the birth canal.  Muscle tension due to anxiety or nervousness.  The uterus tightening (contracting) and relaxing to help move the baby. There are many ways to deal with the pain of labor and delivery. They include:  Taking prenatal classes. Taking these classes helps you know what to expect during your baby's birth. What you learn will increase your confidence and decrease your anxiety.  Practicing relaxation techniques or doing relaxing activities, such as: ? Focused breathing. ? Meditation. ? Visualization. ? Aroma therapy. ? Listening to your favorite music. ? Hypnosis.  Taking a warm shower or bath (hydrotherapy). This may: ? Provide comfort and relaxation. ? Lessen your perception of pain. ? Decrease the amount of pain medicine needed. ? Decrease the length of labor.  Getting a massage or counterpressure on your back.  Applying warm packs or ice packs.  Changing positions often, moving around, or using a birthing ball.  Getting: ? Pain medicine through an IV or injection into a muscle. ? Pain medicine inserted into your spinal column. ? Injections of sterile water just under the skin on your lower back (intradermal injections). ? Laughing gas (nitrous oxide). Discuss your pain control options with your health care provider during your prenatal visits. Explore the options offered by your hospital or birth center. What kinds of medicine are available? There are two kinds of medicines that can be used to relieve pain during labor and delivery:  Analgesics. These medicines decrease pain without causing you to lose feeling or the ability to move your muscles.  Anesthetics. These medicines block feeling in the body and can decrease your  ability to move freely. Both of these kinds of medicine can cause minor side effects, such as nausea, trouble concentrating, and sleepiness. They can also decrease the baby's heart rate before birth and affect the baby's breathing rate after birth. For this reason, health care providers are careful about when and how much medicine is given. What are specific medicines and procedures that provide pain relief? Local Anesthetics Local anesthetics are used to numb a small area of the body. They may be used along with another kind of anesthetic or used to numb the nerves of the vagina, cervix, and perineum during the second stage of labor. General Anesthetics General anesthetics cause you to lose consciousness so you do not feel pain. They are usually only used for an emergency cesarean delivery. General anesthetics are given through an IV tube and a mask. Pudendal Block A pudendal block is a form of local anesthetic. It may be used to relieve the pain associated with pushing or stretching of the perineum at the time of delivery or to further numb the perineum. A pudendal block is done by injecting numbing medicine through the vaginal wall into a nerve in the pelvis. Epidural Analgesia Epidural analgesia is given through a flexible IV catheter that is inserted into the lower back. Numbing medicine is delivered continuously to the area near your spinal column nerves (epidural space). After having this type of analgesia, you may be able to move your legs but you most likely will not be able to walk. Depending on the amount of medicine given, you may lose all feeling in the lower half of your body, or you may retain some level   of sensation, including the urge to push. Epidural analgesia can be used to provide pain relief for a vaginal birth. Spinal Block A spinal block is similar to epidural analgesia, but the medicine is injected into the spinal fluid instead of the epidural space. A spinal block is only given  once. It starts to relieve pain quickly, but the pain relief lasts only 1-6 hours. Spinal blocks can be used for cesarean deliveries. Combined Spinal-Epidural (CSE) Block A CSE block combines the effects of a spinal block and epidural analgesia. The spinal block works quickly to block all pain. The epidural analgesia provides continuous pain relief, even after the effects of the spinal block have worn off. This information is not intended to replace advice given to you by your health care provider. Make sure you discuss any questions you have with your health care provider. Document Revised: 06/24/2017 Document Reviewed: 12/03/2015 Elsevier Patient Education  2020 Elsevier Inc.  [employer] Gain During Pregnancy, Adult A certain amount of weight gain during pregnancy is normal and healthy. How much weight you should gain depends on your overall health and a measurement called BMI (body mass index). BMI is an estimate of your body fat based on your height and weight. You can use an Software engineer to figure out your BMI, or you can ask your health care provider to calculate it for you at your next visit. Your recommended pregnancy weight gain is based on your pre-pregnancy BMI. General guidelines for a healthy total weight gain during pregnancy are listed below. If your BMI at or before the start of your pregnancy is:  Less than 18.5 (underweight), you should gain 28-40 lb (13-18 kg).  18.5-24.9 (normal weight), you should gain 25-35 lb (11-16 kg).  25-29.9 (overweight), you should gain 15-25 lb (7-11 kg).  30 or higher (obese), you should gain 11-20 lb (5-9 kg). These ranges vary depending on your individual health. If you are carrying more than one baby (multiples), it may be safe to gain more weight than these recommendations. If you gain less weight than recommended, that may be safe as long as your baby is growing and developing normally. How can unhealthy weight gain affect me and my  baby? Gaining too much weight during pregnancy can lead to pregnancy complications, such as:  A temporary form of diabetes that develops during pregnancy (gestational diabetes).  High blood pressure during pregnancy and protein in your urine (preeclampsia).  High blood pressure during pregnancy without protein in your urine (gestational hypertension).  Your baby having a high weight at birth, which may: ? Raise your risk of having a more difficult delivery or a surgical delivery (cesarean delivery, or C-section). ? Raise your child's risk of developing obesity during childhood. Not gaining enough weight can be life-threatening for your baby, and it may raise your baby's chances of:  Being born early (preterm).  Growing more slowly than normal during pregnancy (growth restriction).  Having a low weight at birth. What actions can I take to gain a healthy amount of weight during pregnancy? General instructions  Keep track of your weight gain during pregnancy.  Take over-the-counter and prescription medicines only as told by your health care provider. Take all prenatal supplements as directed.  Keep all health care visits during pregnancy (prenatal visits). These visits are a good time to discuss your weight gain. Your health care provider will weigh you at each visit to make sure you are gaining a healthy amount of weight. Nutrition  Eat a balanced, nutrient-rich diet. Eat plenty of: ? Fruits and vegetables, such as berries and broccoli. ? Whole grains, such as millet, barley, whole-wheat breads and cereals, and oatmeal. ? Low-fat dairy products or non-dairy products such as almond milk or rice milk. ? Protein foods, such as lean meat, chicken, eggs, and legumes (such as peas, beans, soybeans, and lentils).  Avoid foods that are fried or have a lot of fat, salt (sodium), or sugar.  Drink enough fluid to keep your urine pale yellow.  Choose healthy snack and drink options when  you are at work or on the go: ? Drink water. Avoid soda, sports drinks, and juices that have added sugar. ? Avoid drinks with caffeine, such as coffee and energy drinks. ? Eat snacks that are high in protein, such as nuts, protein bars, and low-fat yogurt. ? Carry convenient snacks in your purse that do not need refrigeration, such as a pack of trail mix, an apple, or a granola bar.  If you need help improving your diet, work with a health care provider or a diet and nutrition specialist (dietitian). Activity   Exercise regularly, as told by your health care provider. ? If you were active before becoming pregnant, you may be able to continue your regular fitness activities. ? If you were not active before pregnancy, you may gradually build up to exercising for 30 or more minutes on most days of the week. This may include walking, swimming, or yoga.  Ask your health care provider what activities are safe for you. Talk with your health care provider about whether you may need to be excused from certain school or work activities. Where to find more information Learn more about managing your weight gain during pregnancy from:  American Pregnancy Association: www.americanpregnancy.org  [place] Department of Agriculture pregnancy weight gain calculator: https://ball-collins.biz/ Summary  Too much weight gain during pregnancy can lead to complications for you and your baby.  Find out your pre-pregnancy BMI to determine how much weight gain is healthy for you.  Eat nutritious foods and stay active.  Keep all of your prenatal visits as told by your health care provider. This information is not intended to replace advice given to you by your health care provider. Make sure you discuss any questions you have with your health care provider. Document Revised: 04/04/2019 Document Reviewed: 04/01/2017 Elsevier Patient Education  2020 [employer].   Eating Plan for Pregnant Women While you are  pregnant, your body requires additional nutrition to help support your growing baby. You also have a higher need for some vitamins and minerals, such as folic acid, calcium, iron, and vitamin D. Eating a healthy, well-balanced diet is very important for your health and your baby's health. Your need for extra calories varies for the three 26-month segments of your pregnancy (trimesters). For most women, it is recommended to consume:  150 extra calories a day during the first trimester.  300 extra calories a day during the second trimester.  300 extra calories a day during the third trimester. What are tips for following this plan?   Do not try to lose weight or go on a diet during pregnancy.  Limit your overall intake of foods that have "empty calories." These are foods that have little nutritional value, such as sweets, desserts, candies, and sugar-sweetened beverages.  Eat a variety of foods (especially fruits and vegetables) to get a full range of vitamins and minerals.  Take a prenatal vitamin to help meet your  additional vitamin and mineral needs during pregnancy, specifically for folic acid, iron, calcium, and vitamin D.  Remember to stay active. Ask your health care provider what types of exercise and activities are safe for you.  Practice good food safety and cleanliness. Wash your hands before you eat and after you prepare raw meat. Wash all fruits and vegetables well before peeling or eating. Taking these actions can help to prevent food-borne illnesses that can be very dangerous to your baby, such as listeriosis. Ask your health care provider for more information about listeriosis. What does 150 extra calories look like? Healthy options that provide 150 extra calories each day could be any of the following:  6-8 oz (170-230 g) of plain low-fat yogurt with  cup of berries.  1 apple with 2 teaspoons (11 g) of peanut butter.  Cut-up vegetables with  cup (60 g) of hummus.  8 oz  (230 mL) or 1 cup of low-fat chocolate milk.  1 stick of string cheese with 1 medium orange.  1 peanut butter and jelly sandwich that is made with one slice of whole-wheat bread and 1 tsp (5 g) of peanut butter. For 300 extra calories, you could eat two of those healthy options each day. What is a healthy amount of weight to gain? The right amount of weight gain for you is based on your BMI before you became pregnant. If your BMI:  Was less than 18 (underweight), you should gain 28-40 lb (13-18 kg).  Was 18-24.9 (normal), you should gain 25-35 lb (11-16 kg).  Was 25-29.9 (overweight), you should gain 15-25 lb (7-11 kg).  Was 30 or greater (obese), you should gain 11-20 lb (5-9 kg). What if I am having twins or multiples? Generally, if you are carrying twins or multiples:  You may need to eat 300-600 extra calories a day.  The recommended range for total weight gain is 25-54 lb (11-25 kg), depending on your BMI before pregnancy.  Talk with your health care provider to find out about nutritional needs, weight gain, and exercise that is right for you. What foods can I eat?  Fruits All fruits. Eat a variety of colors and types of fruit. Remember to wash your fruits well before peeling or eating. Vegetables All vegetables. Eat a variety of colors and types of vegetables. Remember to wash your vegetables well before peeling or eating. Grains All grains. Choose whole grains, such as whole-wheat bread, oatmeal, or brown rice. Meats and other protein foods Lean meats, including chicken, [place], fish, and lean cuts of beef, veal, or pork. If you eat fish or seafood, choose options that are higher in omega-3 fatty acids and lower in mercury, such as salmon, herring, mussels, trout, sardines, pollock, shrimp, crab, and lobster. Tofu. Tempeh. Beans. Eggs. Peanut butter and other nut butters. Make sure that all meats, poultry, and eggs are cooked to food-safe temperatures or "well-done." Two or  more servings of fish are recommended each week in order to get the most benefits from omega-3 fatty acids that are found in seafood. Choose fish that are lower in mercury. You can find more information online:  [employer]Gaming.ch Dairy Pasteurized milk and milk alternatives (such as almond milk). Pasteurized yogurt and pasteurized cheese. Cottage cheese. Sour cream. Beverages Water. Juices that contain 100% fruit juice or vegetable juice. Caffeine-free teas and decaffeinated coffee. Drinks that contain caffeine are okay to drink, but it is better to avoid caffeine. Keep your total caffeine intake to less than 200 mg each  day (which is 12 oz or 355 mL of coffee, tea, or soda) or the limit as told by your health care provider. Fats and oils Fats and oils are okay to include in moderation. Sweets and desserts Sweets and desserts are okay to include in moderation. Seasoning and other foods All pasteurized condiments. The items listed above may not be a complete list of foods and beverages you can eat. Contact a dietitian for more information. What foods are not recommended? Fruits Unpasteurized fruit juices. Vegetables Raw (unpasteurized) vegetable juices. Meats and other protein foods Lunch meats, bologna, hot dogs, or other deli meats. (If you must eat those meats, reheat them until they are steaming hot.) Refrigerated pat, meat spreads from a meat counter, smoked seafood that is found in the refrigerated section of a store. Raw or undercooked meats, poultry, and eggs. Raw fish, such as sushi or sashimi. Fish that have high mercury content, such as tilefish, shark, swordfish, and king mackerel. To learn more about mercury in fish, talk with your health care provider or look for online resources, such as:  PumpkinSearch.com.eewww.fda.gov Dairy Raw (unpasteurized) milk and any foods that have raw milk in them. Soft cheeses, such as feta, queso blanco, queso fresco, Brie, Camembert cheeses, blue-veined cheeses, and  Panela cheese (unless it is made with pasteurized milk, which must be stated on the label). Beverages Alcohol. Sugar-sweetened beverages, such as sodas, teas, or energy drinks. Seasoning and other foods Homemade fermented foods and drinks, such as pickles, sauerkraut, or kombucha drinks. (Store-bought pasteurized versions of these are okay.) Salads that are made in a store or deli, such as ham salad, chicken salad, egg salad, tuna salad, and seafood salad. The items listed above may not be a complete list of foods and beverages you should avoid. Contact a dietitian for more information. Where to find more information To calculate the number of calories you need based on your height, weight, and activity level, you can use an online calculator such as:  PackageNews.iswww.choosemyplate.gov/MyPlatePlan To calculate how much weight you should gain during pregnancy, you can use an online pregnancy weight gain calculator such as:  http://jones-berg.com/www.choosemyplate.gov/pregnancy-weight-gain-calculator Summary  While you are pregnant, your body requires additional nutrition to help support your growing baby.  Eat a variety of foods, especially fruits and vegetables to get a full range of vitamins and minerals.  Practice good food safety and cleanliness. Wash your hands before you eat and after you prepare raw meat. Wash all fruits and vegetables well before peeling or eating. Taking these actions can help to prevent food-borne illnesses, such as listeriosis, that can be very dangerous to your baby.  Do not eat raw meat or fish. Do not eat fish that have high mercury content, such as tilefish, shark, swordfish, and king mackerel. Do not eat unpasteurized (raw) dairy.  Take a prenatal vitamin to help meet your additional vitamin and mineral needs during pregnancy, specifically for folic acid, iron, calcium, and vitamin D. This information is not intended to replace advice given to you by your health care provider. Make sure you  discuss any questions you have with your health care provider. Document Revised: 11/30/2018 Document Reviewed: 04/08/2017 Elsevier Patient Education  2020 Elsevier Inc.   Fetal Movement Counts Patient Name: ________________________________________________ Patient Due Date: ____________________ What is a fetal movement count?  A fetal movement count is the number of times that you feel your baby move during a certain amount of time. This may also be called a fetal kick count. A  fetal movement count is recommended for every pregnant woman. You may be asked to start counting fetal movements as early as week 28 of your pregnancy. Pay attention to when your baby is most active. You may notice your baby's sleep and wake cycles. You may also notice things that make your baby move more. You should do a fetal movement count:  When your baby is normally most active.  At the same time each day. A good time to count movements is while you are resting, after having something to eat and drink. How do I count fetal movements? 1. Find a quiet, comfortable area. Sit, or lie down on your side. 2. Write down the date, the start time and stop time, and the number of movements that you felt between those two times. Take this information with you to your health care visits. 3. Write down your start time when you feel the first movement. 4. Count kicks, flutters, swishes, rolls, and jabs. You should feel at least 10 movements. 5. You may stop counting after you have felt 10 movements, or if you have been counting for 2 hours. Write down the stop time. 6. If you do not feel 10 movements in 2 hours, contact your health care provider for further instructions. Your health care provider may want to do additional tests to assess your baby's well-being. Contact a health care provider if:  You feel fewer than 10 movements in 2 hours.  Your baby is not moving like he or she usually does. Date: ____________ Start time:  ____________ Stop time: ____________ Movements: ____________ Date: ____________ Start time: ____________ Stop time: ____________ Movements: ____________ Date: ____________ Start time: ____________ Stop time: ____________ Movements: ____________ Date: ____________ Start time: ____________ Stop time: ____________ Movements: ____________ Date: ____________ Start time: ____________ Stop time: ____________ Movements: ____________ Date: ____________ Start time: ____________ Stop time: ____________ Movements: ____________ Date: ____________ Start time: ____________ Stop time: ____________ Movements: ____________ Date: ____________ Start time: ____________ Stop time: ____________ Movements: ____________ Date: ____________ Start time: ____________ Stop time: ____________ Movements: ____________ This information is not intended to replace advice given to you by your health care provider. Make sure you discuss any questions you have with your health care provider. Document Revised: 03/01/2019 Document Reviewed: 03/01/2019 Elsevier Patient Education  2020 Elsevier Inc.   WHAT OB PATIENTS CAN EXPECT   Confirmation of pregnancy and ultrasound ordered if medically indicated-[redacted] weeks gestation  New OB (NOB) intake with nurse and New OB (NOB) labs- [redacted] weeks gestation  New OB (NOB) physical examination with provider- 11/[redacted] weeks gestation  Flu vaccine-[redacted] weeks gestation  Anatomy scan-[redacted] weeks gestation  Glucose tolerance test, blood work to test for anemia, T-dap vaccine-[redacted] weeks gestation  Vaginal swabs/cultures-STD/Group B strep-[redacted] weeks gestation  Appointments every 4 weeks until 28 weeks  Every 2 weeks from 28 weeks until 36 weeks  Weekly visits from 36 weeks until delivery    Vaginal Delivery  Vaginal delivery means that you give birth by pushing your baby out of your birth canal (vagina). A team of health care providers will help you before, during, and after vaginal delivery. Birth  experiences are unique for every woman and every pregnancy, and birth experiences vary depending on where you choose to give birth. What happens when I arrive at the birth center or hospital? Once you are in labor and have been admitted into the hospital or birth center, your health care provider may:  Review your pregnancy history and any concerns that you have.  Insert an IV into one of your veins. This may be used to give you fluids and medicines.  Check your blood pressure, pulse, temperature, and heart rate (vital signs).  Check whether your bag of water (amniotic sac) has broken (ruptured).  Talk with you about your birth plan and discuss pain control options. Monitoring Your health care provider may monitor your contractions (uterine monitoring) and your baby's heart rate (fetal monitoring). You may need to be monitored:  Often, but not continuously (intermittently).  All the time or for long periods at a time (continuously). Continuous monitoring may be needed if: ? You are taking certain medicines, such as medicine to relieve pain or make your contractions stronger. ? You have pregnancy or labor complications. Monitoring may be done by:  Placing a special stethoscope or a handheld monitoring device on your abdomen to check your baby's heartbeat and to check for contractions.  Placing monitors on your abdomen (external monitors) to record your baby's heartbeat and the frequency and length of contractions.  Placing monitors inside your uterus through your vagina (internal monitors) to record your baby's heartbeat and the frequency, length, and strength of your contractions. Depending on the type of monitor, it may remain in your uterus or on your baby's head until birth.  Telemetry. This is a type of continuous monitoring that can be done with external or internal monitors. Instead of having to stay in bed, you are able to move around during telemetry. Physical exam Your health  care provider may perform frequent physical exams. This may include:  Checking how and where your baby is positioned in your uterus.  Checking your cervix to determine: ? Whether it is thinning out (effacing). ? Whether it is opening up (dilating). What happens during labor and delivery?  Normal labor and delivery is divided into the following three stages: Stage 1  This is the longest stage of labor.  This stage can last for hours or days.  Throughout this stage, you will feel contractions. Contractions generally feel mild, infrequent, and irregular at first. They get stronger, more frequent (about every 2-3 minutes), and more regular as you move through this stage.  This stage ends when your cervix is completely dilated to 4 inches (10 cm) and completely effaced. Stage 2  This stage starts once your cervix is completely effaced and dilated and lasts until the delivery of your baby.  This stage may last from 20 minutes to 2 hours.  This is the stage where you will feel an urge to push your baby out of your vagina.  You may feel stretching and burning pain, especially when the widest part of your baby's head passes through the vaginal opening (crowning).  Once your baby is delivered, the umbilical cord will be clamped and cut. This usually occurs after waiting a period of 1-2 minutes after delivery.  Your baby will be placed on your bare chest (skin-to-skin contact) in an upright position and covered with a warm blanket. Watch your baby for feeding cues, like rooting or sucking, and help the baby to your breast for his or her first feeding. Stage 3  This stage starts immediately after the birth of your baby and ends after you deliver the placenta.  This stage may take anywhere from 5 to 30 minutes.  After your baby has been delivered, you will feel contractions as your body expels the placenta and your uterus contracts to control bleeding. What can I expect after labor and  delivery?  After labor is over, you and your baby will be monitored closely until you are ready to go home to ensure that you are both healthy. Your health care team will teach you how to care for yourself and your baby.  You and your baby will stay in the same room (rooming in) during your hospital stay. This will encourage early bonding and successful breastfeeding.  You may continue to receive fluids and medicines through an IV.  Your uterus will be checked and massaged regularly (fundal massage).  You will have some soreness and pain in your abdomen, vagina, and the area of skin between your vaginal opening and your anus (perineum).  If an incision was made near your vagina (episiotomy) or if you had some vaginal tearing during delivery, cold compresses may be placed on your episiotomy or your tear. This helps to reduce pain and swelling.  You may be given a squirt bottle to use instead of wiping when you go to the bathroom. To use the squirt bottle, follow these steps: ? Before you urinate, fill the squirt bottle with warm water. Do not use hot water. ? After you urinate, while you are sitting on the toilet, use the squirt bottle to rinse the area around your urethra and vaginal opening. This rinses away any urine and blood. ? Fill the squirt bottle with clean water every time you use the bathroom.  It is normal to have vaginal bleeding after delivery. Wear a sanitary pad for vaginal bleeding and discharge. Summary  Vaginal delivery means that you will give birth by pushing your baby out of your birth canal (vagina).  Your health care provider may monitor your contractions (uterine monitoring) and your baby's heart rate (fetal monitoring).  Your health care provider may perform a physical exam.  Normal labor and delivery is divided into three stages.  After labor is over, you and your baby will be monitored closely until you are ready to go home. This information is not intended  to replace advice given to you by your health care provider. Make sure you discuss any questions you have with your health care provider. Document Revised: 08/16/2017 Document Reviewed: 08/16/2017 Elsevier Patient Education  2020 [employer].   [facility]  [address] [place], [place], [place] [address]  Phone: [phone]   [facility] (second location)  [address] [place], [place] [address]  Phone: [phone]   [facility]) [address] [place], [place] [address] Phone: [phone]   [facility]  [address], [place], [place] [address]  Phone: [phone]  Breastfeeding  Choosing to breastfeed is one of the best decisions you can make for yourself and your baby. A change in hormones during pregnancy causes your breasts to make breast milk in your milk-producing glands. Hormones prevent breast milk from being released before your baby is born. They also prompt milk flow after birth. Once breastfeeding has begun, thoughts of your baby, as well as his or her sucking or crying, can stimulate the release of milk from your milk-producing glands. Benefits of breastfeeding Research shows that breastfeeding offers many health benefits for infants and mothers. It also offers a cost-free and convenient way to feed your baby. For your baby  Your first milk (colostrum) helps your baby's digestive system to function better.  Special cells in your milk (antibodies) help your baby to fight off infections.  Breastfed babies are less likely to develop asthma,  allergies, obesity, or type 2 diabetes. They are also at lower risk for sudden infant death syndrome (SIDS).  Nutrients in breast milk are better able to meet your baby's needs compared to infant formula.  Breast milk improves your baby's brain development. For you  Breastfeeding helps to create a very special bond between you and your  baby.  Breastfeeding is convenient. Breast milk costs nothing and is always available at the correct temperature.  Breastfeeding helps to burn calories. It helps you to lose the weight that you gained during pregnancy.  Breastfeeding makes your uterus return faster to its size before pregnancy. It also slows bleeding (lochia) after you give birth.  Breastfeeding helps to lower your risk of developing type 2 diabetes, osteoporosis, rheumatoid arthritis, cardiovascular disease, and breast, ovarian, uterine, and endometrial cancer later in life. Breastfeeding basics Starting breastfeeding  Find a comfortable place to sit or lie down, with your neck and back well-supported.  Place a pillow or a rolled-up blanket under your baby to bring him or her to the level of your breast (if you are seated). Nursing pillows are specially designed to help support your arms and your baby while you breastfeed.  Make sure that your baby's tummy (abdomen) is facing your abdomen.  Gently massage your breast. With your fingertips, massage from the outer edges of your breast inward toward the nipple. This encourages milk flow. If your milk flows slowly, you may need to continue this action during the feeding.  Support your breast with 4 fingers underneath and your thumb above your nipple (make the letter "C" with your hand). Make sure your fingers are well away from your nipple and your baby's mouth.  Stroke your baby's lips gently with your finger or nipple.  When your baby's mouth is open wide enough, quickly bring your baby to your breast, placing your entire nipple and as much of the areola as possible into your baby's mouth. The areola is the colored area around your nipple. ? More areola should be visible above your baby's upper lip than below the lower lip. ? Your baby's lips should be opened and extended outward (flanged) to ensure an adequate, comfortable latch. ? Your baby's tongue should be between his  or her lower gum and your breast.  Make sure that your baby's mouth is correctly positioned around your nipple (latched). Your baby's lips should create a seal on your breast and be turned out (everted).  It is common for your baby to suck about 2-3 minutes in order to start the flow of breast milk. Latching Teaching your baby how to latch onto your breast properly is very important. An improper latch can cause nipple pain, decreased milk supply, and poor weight gain in your baby. Also, if your baby is not latched onto your nipple properly, he or she may swallow some air during feeding. This can make your baby fussy. Burping your baby when you switch breasts during the feeding can help to get rid of the air. However, teaching your baby to latch on properly is still the best way to prevent fussiness from swallowing air while breastfeeding. Signs that your baby has successfully latched onto your nipple  Silent tugging or silent sucking, without causing you pain. Infant's lips should be extended outward (flanged).  Swallowing heard between every 3-4 sucks once your milk has started to flow (after your let-down milk reflex occurs).  Muscle movement above and in front of his or her ears while sucking. Signs that  your baby has not successfully latched onto your nipple  Sucking sounds or smacking sounds from your baby while breastfeeding.  Nipple pain. If you think your baby has not latched on correctly, slip your finger into the corner of your baby's mouth to break the suction and place it between your baby's gums. Attempt to start breastfeeding again. Signs of successful breastfeeding Signs from your baby  Your baby will gradually decrease the number of sucks or will completely stop sucking.  Your baby will fall asleep.  Your baby's body will relax.  Your baby will retain a small amount of milk in his or her mouth.  Your baby will let go of your breast by himself or herself. Signs from  you  Breasts that have increased in firmness, weight, and size 1-3 hours after feeding.  Breasts that are softer immediately after breastfeeding.  Increased milk volume, as well as a change in milk consistency and color by the fifth day of breastfeeding.  Nipples that are not sore, cracked, or bleeding. Signs that your baby is getting enough milk  Wetting at least 1-2 diapers during the first 24 hours after birth.  Wetting at least 5-6 diapers every 24 hours for the first week after birth. The urine should be clear or pale yellow by the age of 5 days.  Wetting 6-8 diapers every 24 hours as your baby continues to grow and develop.  At least 3 stools in a 24-hour period by the age of 5 days. The stool should be soft and yellow.  At least 3 stools in a 24-hour period by the age of 7 days. The stool should be seedy and yellow.  No loss of weight greater than 10% of birth weight during the first 3 days of life.  Average weight gain of 4-7 oz (113-198 g) per week after the age of 4 days.  Consistent daily weight gain by the age of 5 days, without weight loss after the age of 2 weeks. After a feeding, your baby may spit up a small amount of milk. This is normal. Breastfeeding frequency and duration Frequent feeding will help you make more milk and can prevent sore nipples and extremely full breasts (breast engorgement). Breastfeed when you feel the need to reduce the fullness of your breasts or when your baby shows signs of hunger. This is called "breastfeeding on demand." Signs that your baby is hungry include:  Increased alertness, activity, or restlessness.  Movement of the head from side to side.  Opening of the mouth when the corner of the mouth or cheek is stroked (rooting).  Increased sucking sounds, smacking lips, cooing, sighing, or squeaking.  Hand-to-mouth movements and sucking on fingers or hands.  Fussing or crying. Avoid introducing a pacifier to your baby in the first  4-6 weeks after your baby is born. After this time, you may choose to use a pacifier. Research has shown that pacifier use during the first year of a baby's life decreases the risk of sudden infant death syndrome (SIDS). Allow your baby to feed on each breast as long as he or she wants. When your baby unlatches or falls asleep while feeding from the first breast, offer the second breast. Because newborns are often sleepy in the first few weeks of life, you may need to awaken your baby to get him or her to feed. Breastfeeding times will vary from baby to baby. However, the following rules can serve as a guide to help you make sure that  your baby is properly fed:  Newborns (babies 98 weeks of age or younger) may breastfeed every 1-3 hours.  Newborns should not go without breastfeeding for longer than 3 hours during the day or 5 hours during the night.  You should breastfeed your baby a minimum of 8 times in a 24-hour period. Breast milk pumping     Pumping and storing breast milk allows you to make sure that your baby is exclusively fed your breast milk, even at times when you are unable to breastfeed. This is especially important if you go back to work while you are still breastfeeding, or if you are not able to be present during feedings. Your lactation consultant can help you find a method of pumping that works best for you and give you guidelines about how long it is safe to store breast milk. Caring for your breasts while you breastfeed Nipples can become dry, cracked, and sore while breastfeeding. The following recommendations can help keep your breasts moisturized and healthy:  Avoid using soap on your nipples.  Wear a supportive bra designed especially for nursing. Avoid wearing underwire-style bras or extremely tight bras (sports bras).  Air-dry your nipples for 3-4 minutes after each feeding.  Use only cotton bra pads to absorb leaked breast milk. Leaking of breast milk between feedings  is normal.  Use lanolin on your nipples after breastfeeding. Lanolin helps to maintain your skin's normal moisture barrier. Pure lanolin is not harmful (not toxic) to your baby. You may also hand express a few drops of breast milk and gently massage that milk into your nipples and allow the milk to air-dry. In the first few weeks after giving birth, some women experience breast engorgement. Engorgement can make your breasts feel heavy, warm, and tender to the touch. Engorgement peaks within 3-5 days after you give birth. The following recommendations can help to ease engorgement:  Completely empty your breasts while breastfeeding or pumping. You may want to start by applying warm, moist heat (in the shower or with warm, water-soaked hand towels) just before feeding or pumping. This increases circulation and helps the milk flow. If your baby does not completely empty your breasts while breastfeeding, pump any extra milk after he or she is finished.  Apply ice packs to your breasts immediately after breastfeeding or pumping, unless this is too uncomfortable for you. To do this: ? Put ice in a plastic bag. ? Place a towel between your skin and the bag. ? Leave the ice on for 20 minutes, 2-3 times a day.  Make sure that your baby is latched on and positioned properly while breastfeeding. If engorgement persists after 48 hours of following these recommendations, contact your health care provider or a Advertising copywriter. Overall health care recommendations while breastfeeding  Eat 3 healthy meals and 3 snacks every day. Well-nourished mothers who are breastfeeding need an additional 450-500 calories a day. You can meet this requirement by increasing the amount of a balanced diet that you eat.  Drink enough water to keep your urine pale yellow or clear.  Rest often, relax, and continue to take your prenatal vitamins to prevent fatigue, stress, and low vitamin and mineral levels in your body (nutrient  deficiencies).  Do not use any products that contain nicotine or tobacco, such as cigarettes and e-cigarettes. Your baby may be harmed by chemicals from cigarettes that pass into breast milk and exposure to secondhand smoke. If you need help quitting, ask your health care provider.  Avoid  alcohol.  Do not use illegal drugs or marijuana.  Talk with your health care provider before taking any medicines. These include over-the-counter and prescription medicines as well as vitamins and herbal supplements. Some medicines that may be harmful to your baby can pass through breast milk.  It is possible to become pregnant while breastfeeding. If birth control is desired, ask your health care provider about options that will be safe while breastfeeding your baby. Where to find more information: [employer]: www.llli.org Contact a health care provider if:  You feel like you want to stop breastfeeding or have become frustrated with breastfeeding.  Your nipples are cracked or bleeding.  Your breasts are red, tender, or warm.  You have: ? Painful breasts or nipples. ? A swollen area on either breast. ? A fever or chills. ? Nausea or vomiting. ? Drainage other than breast milk from your nipples.  Your breasts do not become full before feedings by the fifth day after you give birth.  You feel sad and depressed.  Your baby is: ? Too sleepy to eat well. ? Having trouble sleeping. ? More than 11 week old and wetting fewer than 6 diapers in a 24-hour period. ? Not gaining weight by 285 days of age.  Your baby has fewer than 3 stools in a 24-hour period.  Your baby's skin or the white parts of his or her eyes become yellow. Get help right away if:  Your baby is overly tired (lethargic) and does not want to wake up and feed.  Your baby develops an unexplained fever. Summary  Breastfeeding offers many health benefits for infant and mothers.  Try to breastfeed your infant when  he or she shows early signs of hunger.  Gently tickle or stroke your baby's lips with your finger or nipple to allow the baby to open his or her mouth. Bring the baby to your breast. Make sure that much of the areola is in your baby's mouth. Offer one side and burp the baby before you offer the other side.  Talk with your health care provider or lactation consultant if you have questions or you face problems as you breastfeed. This information is not intended to replace advice given to you by your health care provider. Make sure you discuss any questions you have with your health care provider. Document Revised: 10/06/2017 Document Reviewed: 08/13/2016 Elsevier Patient Education  2020 [employer]   Breastfeeding and Self-Care It is normal to have some problems when you start to breastfeed your new baby. But there are things that you can do to take care of yourself and help prevent many common problems. This includes keeping your breasts healthy and making sure that your baby's mouth attaches (latches) properly to your nipple for feedings. Work with your doctor or breastfeeding specialist (Advertising copywriterlactation consultant) to find what works best for you. Follow these instructions at home: Breastfeeding strategy   Always make sure that your baby latches properly to breastfeed.  Make sure that your baby is in a proper position. Try different breastfeeding positions to find one that works best for you and your baby.  Breastfeed when you feel like you need to make your breasts less full or when your baby shows signs of hunger. This is called "breastfeeding on demand."  Do not delay feedings.  Try to relax when it is time to feed your baby. This helps your body release milk from your breast.  To help increase milk flow: ? Remove a  small amount of milk from your breast right before breastfeeding. Do this using a pump or by squeezing with your hand. ? Apply warm, moist heat to your breast right before  feeding. You can do this in the shower or with hand towels soaked with warm water. ? Massage your breast right before or during feeding. Breast care   To help your breasts stay healthy and keep them from getting too dry: ? Avoid using soap on your nipples. ? Let your nipples air-dry for 3-4 minutes after each feeding. ? Use only cotton bra pads to soak up breast milk that leaks. Be sure to change the pads if they become soaked with milk. If you use bra pads that can be thrown away, change them often. ? Put some lanolin on your nipples after breastfeeding. Pure lanolin does not need to be washed off your nipple before you feed your baby again. Pure lanolin is not harmful to your baby. ? Rub some breast milk into your nipples:  Use your hand to squeeze out a few drops of breast milk.  Gently massage the milk into your nipples.  Let your nipples air-dry.  Wear a supportive nursing bra. Avoid wearing: ? Tight clothing. ? Underwire bras or bras that put pressure on your breasts.  Use ice to help relieve pain or swelling of your breasts: ? Put ice in a plastic bag. ? Place a towel between your skin and the bag. ? Leave the ice on for 20 minutes, 2-3 times a day. General instructions  Drink enough fluid to keep your pee (urine) pale yellow.  Get plenty of rest. Sleep when your baby sleeps.  Talk to your doctor or breastfeeding specialist before taking any herbal supplements. Contact a health care provider if:  You have nipple pain.  You have cracking or soreness in your nipples that lasts longer than 1 week.  Your breasts are overfilled with milk (engorgement) and this lasts longer than 48 hours.  You have a fever.  You have pus-like fluid coming from your nipple.  You have redness, a rash, swelling, itching, or burning on your breast.  Your baby does not gain weight.  Your baby loses weight. Summary  There are things that you can do to take care of yourself and help  prevent many common breastfeeding problems.  Always make sure that your baby's mouth attaches (latches) to your nipple properly to breastfeed.  Keep your nipples from getting too dry, drink plenty of fluid, and get plenty of rest.  Feed on demand. Do not delay feedings. This information is not intended to replace advice given to you by your health care provider. Make sure you discuss any questions you have with your health care provider. Document Revised: 11/01/2018 Document Reviewed: 02/16/2017 Elsevier Patient Education  2020 [employer].  Breastfeeding Tips for a Good Latch Latching is how your baby's mouth attaches to your nipple to breastfeed. It is an important part of breastfeeding. Your baby may have trouble latching for a number of reasons. A poor latch may cause you to have cracked or sore nipples or other problems. Follow these instructions at home: How to position your baby  Find a comfortable place to sit or lie down. Your neck and back should be well supported.  If you are seated, place a pillow or rolled-up blanket under your baby. This will bring him or her to the level of your breast.  Make sure that your baby's belly (abdomen) is facing your belly.  Try different positions to find one that works best for you and your baby. How to help your baby latch   To start, gently rub your breast. Move your fingertips in a circle as you massage from your chest wall toward your nipple. This helps milk flow. Keep doing this during feeding if needed.  Position your breast. Hold your breast with four fingers underneath and your thumb above your nipple. Keep your fingers away from your nipple and your baby's mouth. Follow these steps to help your baby latch: 1. Rub your baby's lips gently with your finger or nipple. 2. When your baby's mouth is open wide enough, quickly bring your baby to your breast and place your whole nipple into your baby's mouth. Place as much of the colored  area around your nipple (areola)as possible into your baby's mouth. 3. Your baby's tongue should be between his or her lower gum and your breast. 4. You should be able to see more areola above your baby's upper lip than below the lower lip. 5. When your baby starts sucking, you will feel a gentle pull on your nipple. You should not feel any pain. Be patient. It is common for a baby to suck for about 2-3 minutes to start the flow of breast milk. 6. Make sure that your baby's mouth is in the right position around your nipple. Your baby's lips should make a seal on your breast and be turned outward.  General instructions  Look for these signs that your baby has latched on to your nipple: ? The baby is quietly tugging or sucking without causing you pain. ? You hear the baby swallow after every 3 or 4 sucks. ? You see movement above and in front of the baby's ears while he or she is sucking.  Be aware of these signs that your baby has not latched on to your nipple: ? The baby makes sucking sounds or smacking sounds while feeding. ? You have nipple pain.  If your baby is not latched well, put your little finger between your baby's gums and your nipple. This will break the seal. Then try to help your baby latch again.  If you keep having problems, get help from a breastfeeding specialist (Advertising copywriter). Contact a doctor if:  You have cracking or soreness in your nipples that lasts longer than 1 week.  You have nipple pain.  Your breasts are filled with too much milk (engorgement), and this does not improve after 48-72 hours.  You have a plugged milk duct and a fever.  You follow the tips for a good latch but you keep having problems or concerns.  You have a pus-like fluid coming from your breast.  Your baby is not gaining weight.  Your baby loses weight. Summary  Latching is how your baby's mouth attaches to your nipple to breastfeed.  Try different positions for  breastfeeding to find one that works best for you and your baby.  A poor latch may cause you to have cracked or sore nipples or other problems. This information is not intended to replace advice given to you by your health care provider. Make sure you discuss any questions you have with your health care provider. Document Revised: 11/01/2018 Document Reviewed: 02/16/2017 Elsevier Patient Education  2020 [employer].

## 2019-09-14 NOTE — Progress Notes (Signed)
ROB-No complaints.  

## 2019-09-15 LAB — CBC
Hematocrit: 35.3 % (ref 34.0–46.6)
Hemoglobin: 12.2 g/dL (ref 11.1–15.9)
MCH: 30.2 pg (ref 26.6–33.0)
MCHC: 34.6 g/dL (ref 31.5–35.7)
MCV: 87 fL (ref 79–97)
Platelets: 279 10*3/uL (ref 150–450)
RBC: 4.04 x10E6/uL (ref 3.77–5.28)
RDW: 12.6 % (ref 11.7–15.4)
WBC: 8.3 10*3/uL (ref 3.4–10.8)

## 2019-09-15 LAB — GLUCOSE, 1 HOUR GESTATIONAL: Gestational Diabetes Screen: 87 mg/dL (ref 65–139)

## 2019-09-15 LAB — RPR: RPR Ser Ql: NONREACTIVE

## 2019-09-26 ENCOUNTER — Ambulatory Visit (INDEPENDENT_AMBULATORY_CARE_PROVIDER_SITE_OTHER): Payer: Medicaid Other | Admitting: Certified Nurse Midwife

## 2019-09-26 ENCOUNTER — Encounter: Payer: Self-pay | Admitting: Certified Nurse Midwife

## 2019-09-26 ENCOUNTER — Other Ambulatory Visit: Payer: Self-pay

## 2019-09-26 VITALS — BP 113/66 | HR 93 | Wt 200.3 lb

## 2019-09-26 DIAGNOSIS — Z3483 Encounter for supervision of other normal pregnancy, third trimester: Secondary | ICD-10-CM

## 2019-09-26 DIAGNOSIS — Z23 Encounter for immunization: Secondary | ICD-10-CM

## 2019-09-26 LAB — POCT URINALYSIS DIPSTICK OB
Bilirubin, UA: NEGATIVE
Blood, UA: NEGATIVE
Glucose, UA: NEGATIVE
Ketones, UA: NEGATIVE
Leukocytes, UA: NEGATIVE
Nitrite, UA: NEGATIVE
POC,PROTEIN,UA: NEGATIVE
Spec Grav, UA: 1.015 (ref 1.010–1.025)
Urobilinogen, UA: 0.2 E.U./dL
pH, UA: 5 (ref 5.0–8.0)

## 2019-09-26 NOTE — Patient Instructions (Signed)
[place] [facility]  [facility]  [address], [place], [place] [address]  Phone: [phone]  [facility] (second location)  [address] [place], [place] [address]  Phone: [phone]  [facility] ([place]) [address], [place], [place] [address] Phone: [phone]  [facility]  [address] [place], [place] [address]  Phone: [phone] 

## 2019-09-26 NOTE — Progress Notes (Signed)
ROB doing well. Feels good movement. Feels like the baby has dropped , increased pelvic pressure. Reassurance given. Discussed Round ligament and decreased elasticity with each preganancy. Encourage use of belly band for support. Feels 3-4 ctx daily, reassurance given. Reviwed S&S PTL. Reviewed birth plan. Pt plans natural birth with Doula. Delayed cord clamping and immediate skin to skin. Copy of Birth plan scanned to chart. Follow up 2 wk with Marcelino Duster.   Doreene Burke, CNM

## 2019-10-11 NOTE — Patient Instructions (Signed)
Third Trimester of Pregnancy  The third trimester is from week 28 through week 40 (months 7 through 9). This trimester is when your unborn baby (fetus) is growing very fast. At the end of the ninth month, the unborn baby is about 20 inches in length. It weighs about 6-10 pounds. Follow these instructions at home: Medicines  Take over-the-counter and prescription medicines only as told by your doctor. Some medicines are safe and some medicines are not safe during pregnancy.  Take a prenatal vitamin that contains at least 600 micrograms (mcg) of folic acid.  If you have trouble pooping (constipation), take medicine that will make your stool soft (stool softener) if your doctor approves. Eating and drinking   Eat regular, healthy meals.  Avoid raw meat and uncooked cheese.  If you get low calcium from the food you eat, talk to your doctor about taking a daily calcium supplement.  Eat four or five small meals rather than three large meals a day.  Avoid foods that are high in fat and sugars, such as fried and sweet foods.  To prevent constipation: ? Eat foods that are high in fiber, like fresh fruits and vegetables, whole grains, and beans. ? Drink enough fluids to keep your pee (urine) clear or pale yellow. Activity  Exercise only as told by your doctor. Stop exercising if you start to have cramps.  Avoid heavy lifting, wear low heels, and sit up straight.  Do not exercise if it is too hot, too humid, or if you are in a place of great height (high altitude).  You may continue to have sex unless your doctor tells you not to. Relieving pain and discomfort  Wear a good support bra if your breasts are tender.  Take frequent breaks and rest with your legs raised if you have leg cramps or low back pain.  Take warm water baths (sitz baths) to soothe pain or discomfort caused by hemorrhoids. Use hemorrhoid cream if your doctor approves.  If you develop puffy, bulging veins (varicose  veins) in your legs: ? Wear support hose or compression stockings as told by your doctor. ? Raise (elevate) your feet for 15 minutes, 3-4 times a day. ? Limit salt in your food. Safety  Wear your seat belt when driving.  Make a list of emergency phone numbers, including numbers for family, friends, the hospital, and police and fire departments. Preparing for your baby's arrival To prepare for the arrival of your baby:  Take prenatal classes.  Practice driving to the hospital.  Visit the hospital and tour the maternity area.  Talk to your work about taking leave once the baby comes.  Pack your hospital bag.  Prepare the baby's room.  Go to your doctor visits.  Buy a rear-facing car seat. Learn how to install it in your car. General instructions  Do not use hot tubs, steam rooms, or saunas.  Do not use any products that contain nicotine or tobacco, such as cigarettes and e-cigarettes. If you need help quitting, ask your doctor.  Do not drink alcohol.  Do not douche or use tampons or scented sanitary pads.  Do not cross your legs for long periods of time.  Do not travel for long distances unless you must. Only do so if your doctor says it is okay.  Visit your dentist if you have not gone during your pregnancy. Use a soft toothbrush to brush your teeth. Be gentle when you floss.  Avoid cat litter boxes and soil  used by cats. These carry germs that can cause birth defects in the baby and can cause a loss of your baby (miscarriage) or stillbirth.  Keep all your prenatal visits as told by your doctor. This is important. Contact a doctor if:  You are not sure if you are in labor or if your water has broken.  You are dizzy.  You have mild cramps or pressure in your lower belly.  You have a nagging pain in your belly area.  You continue to feel sick to your stomach, you throw up, or you have watery poop.  You have bad smelling fluid coming from your vagina.  You have  pain when you pee. Get help right away if:  You have a fever.  You are leaking fluid from your vagina.  You are spotting or bleeding from your vagina.  You have severe belly cramps or pain.  You lose or gain weight quickly.  You have trouble catching your breath and have chest pain.  You notice sudden or extreme puffiness (swelling) of your face, hands, ankles, feet, or legs.  You have not felt the baby move in over an hour.  You have severe headaches that do not go away with medicine.  You have trouble seeing.  You are leaking, or you are having a gush of fluid, from your vagina before you are 37 weeks.  You have regular belly spasms (contractions) before you are 37 weeks. Summary  The third trimester is from week 28 through week 40 (months 7 through 9). This time is when your unborn baby is growing very fast.  Follow your doctor's advice about medicine, food, and activity.  Get ready for the arrival of your baby by taking prenatal classes, getting all the baby items ready, preparing the baby's room, and visiting your doctor to be checked.  Get help right away if you are bleeding from your vagina, or you have chest pain and trouble catching your breath, or if you have not felt your baby move in over an hour. This information is not intended to replace advice given to you by your health care provider. Make sure you discuss any questions you have with your health care provider. Document Revised: 11/02/2018 Document Reviewed: 08/17/2016 Elsevier Patient Education  [place]. Common Medications Safe in Pregnancy  Acne:      Constipation:  Benzoyl Peroxide     Colace  Clindamycin      Dulcolax Suppository  Topica Erythromycin     Fibercon  Salicylic Acid      Metamucil         Miralax AVOID:        Senakot   Accutane    Cough:  Retin-A       Cough Drops  Tetracycline      Phenergan w/ Codeine if Rx  Minocycline      Robitussin (Plain &  DM)  Antibiotics:     Crabs/Lice:  Ceclor       RID  Cephalosporins    AVOID:  E-Mycins      Kwell  Keflex  Macrobid/Macrodantin   Diarrhea:  Penicillin      Kao-Pectate  Zithromax      Imodium AD         PUSH FLUIDS AVOID:       Cipro     Fever:  Tetracycline      Tylenol (Regular or Extra  Minocycline       Strength)  Levaquin  Extra Strength-Do not          Exceed 8 tabs/24 hrs Caffeine:        <200mg/day (equiv. To 1 cup of coffee or  approx. 3 12 oz sodas)         Gas: Cold/Hayfever:       Gas-X  Benadryl      Mylicon  Claritin       Phazyme  **Claritin-D        Chlor-Trimeton    Headaches:  Dimetapp      ASA-Free Excedrin  Drixoral-Non-Drowsy     Cold Compress  Mucinex (Guaifenasin)     Tylenol (Regular or Extra  Sudafed/Sudafed-12 Hour     Strength)  **Sudafed PE Pseudoephedrine   Tylenol Cold & Sinus     Vicks Vapor Rub  Zyrtec  **AVOID if Problems With Blood Pressure         Heartburn: Avoid lying down for at least 1 hour after meals  Aciphex      Maalox     Rash:  Milk of Magnesia     Benadryl    Mylanta       1% Hydrocortisone Cream  Pepcid  Pepcid Complete   Sleep Aids:  Prevacid      Ambien   Prilosec       Benadryl  Rolaids       Chamomile Tea  Tums (Limit 4/day)     Unisom  Zantac       Tylenol PM         Warm milk-add vanilla or  Hemorrhoids:       Sugar for taste  Anusol/Anusol H.C.  (RX: Analapram 2.5%)  Sugar Substitutes:  Hydrocortisone OTC     Ok in moderation  Preparation H      Tucks        Vaseline lotion applied to tissue with wiping    Herpes:     Throat:  Acyclovir      Oragel  Famvir  Valtrex     Vaccines:         Flu Shot Leg Cramps:       *Gardasil  Benadryl      Hepatitis A         Hepatitis B Nasal Spray:       Pneumovax  Saline Nasal Spray     Polio Booster         Tetanus Nausea:       Tuberculosis test or PPD  Vitamin B6 25 mg TID   AVOID:    Dramamine      *Gardasil  Emetrol       Live  Poliovirus  Ginger Root 250 mg QID    MMR (measles, mumps &  High Complex Carbs @ Bedtime    rebella)  Sea Bands-Accupressure    Varicella (Chickenpox)  Unisom 1/2 tab TID     *No known complications           If received before Pain:         Known pregnancy;   Darvocet       Resume series after  Lortab        Delivery  Percocet    Yeast:   Tramadol      Femstat  Tylenol 3      Gyne-lotrimin  Ultram       Monistat  Vicodin           MISC:           All Sunscreens           Hair Coloring/highlights          Insect Repellant's          (Including DEET)         Mystic Tans

## 2019-10-11 NOTE — Progress Notes (Signed)
ROB-Pt present for routine prenatal care. Pt stated having lower abd pain/pressure, contractions braxton hicks.

## 2019-10-15 ENCOUNTER — Ambulatory Visit (INDEPENDENT_AMBULATORY_CARE_PROVIDER_SITE_OTHER): Payer: Medicaid Other | Admitting: Certified Nurse Midwife

## 2019-10-15 ENCOUNTER — Other Ambulatory Visit: Payer: Self-pay

## 2019-10-15 ENCOUNTER — Encounter: Payer: Self-pay | Admitting: Certified Nurse Midwife

## 2019-10-15 VITALS — BP 103/65 | HR 78 | Wt 206.7 lb

## 2019-10-15 DIAGNOSIS — Z3483 Encounter for supervision of other normal pregnancy, third trimester: Secondary | ICD-10-CM

## 2019-10-15 DIAGNOSIS — Z3A33 33 weeks gestation of pregnancy: Secondary | ICD-10-CM

## 2019-10-15 LAB — POCT URINALYSIS DIPSTICK OB
Bilirubin, UA: NEGATIVE
Blood, UA: NEGATIVE
Glucose, UA: NEGATIVE
Ketones, UA: NEGATIVE
Nitrite, UA: NEGATIVE
POC,PROTEIN,UA: NEGATIVE
Spec Grav, UA: 1.01 (ref 1.010–1.025)
Urobilinogen, UA: 0.2 E.U./dL
pH, UA: 7.5 (ref 5.0–8.0)

## 2019-10-15 NOTE — Progress Notes (Signed)
ROB-Reports intermittent lower abdominal pain, pelvic pressure and occasional contractions. Discussed home treatment measures. Referral to Lactation for creation of feeding plan due to history of low milk supply, see orders. Anticipatory guidance regarding course of prenatal care. Reviewed red flag symptoms and when to call. RTC x 2 weeks for ROB or sooner if needed.

## 2019-10-29 ENCOUNTER — Ambulatory Visit (INDEPENDENT_AMBULATORY_CARE_PROVIDER_SITE_OTHER): Payer: Medicaid Other | Admitting: Certified Nurse Midwife

## 2019-10-29 ENCOUNTER — Encounter: Payer: Self-pay | Admitting: Certified Nurse Midwife

## 2019-10-29 ENCOUNTER — Other Ambulatory Visit: Payer: Self-pay

## 2019-10-29 VITALS — BP 106/73 | HR 78 | Wt 207.6 lb

## 2019-10-29 DIAGNOSIS — Z3483 Encounter for supervision of other normal pregnancy, third trimester: Secondary | ICD-10-CM

## 2019-10-29 LAB — POCT URINALYSIS DIPSTICK OB
Bilirubin, UA: NEGATIVE
Blood, UA: NEGATIVE
Glucose, UA: NEGATIVE
Ketones, UA: NEGATIVE
Leukocytes, UA: NEGATIVE
Nitrite, UA: NEGATIVE
POC,PROTEIN,UA: NEGATIVE
Spec Grav, UA: 1.01 (ref 1.010–1.025)
Urobilinogen, UA: 0.2 E.U./dL
pH, UA: 6.5 (ref 5.0–8.0)

## 2019-10-29 NOTE — Progress Notes (Signed)
ROB doing well. Feels good movement. Discussed ready set baby. See check list for topics covered today. Discussed GBS testing next visit. She verbalizes and agrees . Follow up 1 wk with Marcelino Duster.   Doreene Burke, CNM

## 2019-10-29 NOTE — Patient Instructions (Signed)
Group B Streptococcus Infection During Pregnancy °Group B Streptococcus (GBS) is a type of bacteria that is often found in healthy people. It is commonly found in the rectum, vagina, and intestines. In people who are healthy and not pregnant, the bacteria rarely cause serious illness or complications. However, women who test positive for GBS during pregnancy can pass the bacteria to the baby during childbirth. This can cause serious infection in the baby after birth. °Women with GBS may also have infections during their pregnancy or soon after childbirth. The infections include urinary tract infections (UTIs) or infections of the uterus. GBS also increases a woman's risk of complications during pregnancy, such as early labor or delivery, miscarriage, or stillbirth. Routine testing for GBS is recommended for all pregnant women. °What are the causes? °This condition is caused by bacteria called Streptococcus agalactiae. °What increases the risk? °You may have a higher risk for GBS infection during pregnancy if you had one during a past pregnancy. °What are the signs or symptoms? °In most cases, GBS infection does not cause symptoms in pregnant women. If symptoms exist, they may include: °· Labor that starts before the 37th week of pregnancy. °· A UTI or bladder infection. This may cause a fever, frequent urination, or pain and burning during urination. °· Fever during labor. There can also be a rapid heartbeat in the mother or baby. °Rare but serious symptoms of a GBS infection in women include: °· Blood infection (septicemia). This may cause fever, chills, or confusion. °· Lung infection (pneumonia). This may cause fever, chills, cough, rapid breathing, chest pain, or difficulty breathing. °· Bone, joint, skin, or soft tissue infection. °How is this diagnosed? °You may be screened for GBS between week 35 and week 37 of pregnancy. If you have symptoms of preterm labor, you may be screened earlier. This condition is  diagnosed based on lab test results from: °· A swab of fluid from the vagina and rectum. °· A urine sample. °How is this treated? °This condition is treated with antibiotic medicine. Antibiotic medicine may be given: °· To you when you go into labor, or as soon as your water breaks. The medicines will continue until after you give birth. If you are having a cesarean delivery, you do not need antibiotics unless your water has broken. °· To your baby, if he or she requires treatment. Your health care provider will check your baby to decide if he or she needs antibiotics to prevent a serious infection. °Follow these instructions at home: °· Take over-the-counter and prescription medicines only as told by your health care provider. °· Take your antibiotic medicine as told by your health care provider. Do not stop taking the antibiotic even if you start to feel better. °· Keep all pre-birth (prenatal) visits and follow-up visits as told by your health care provider. This is important. °Contact a health care provider if: °· You have pain or burning when you urinate. °· You have to urinate more often than usual. °· You have a fever or chills. °· You develop a bad-smelling vaginal discharge. °Get help right away if: °· Your water breaks. °· You go into labor. °· You have severe pain in your abdomen. °· You have difficulty breathing. °· You have chest pain. °These symptoms may represent a serious problem that is an emergency. Do not wait to see if the symptoms will go away. Get medical help right away. Call your local emergency services (911 in the [place] Do not drive yourself to   the hospital. °Summary °· GBS is a type of bacteria that is common in healthy people. °· During pregnancy, colonization with GBS can cause serious complications for you or your baby. °· Your health care provider will screen you between 35 and 37 weeks of pregnancy to determine if you are colonized with GBS. °· If you are colonized with GBS during  pregnancy, your health care provider will recommend antibiotics through an IV during labor. °· After delivery, your baby will be evaluated for complications related to potential GBS infection and may require antibiotics to prevent a serious infection. °This information is not intended to replace advice given to you by your health care provider. Make sure you discuss any questions you have with your health care provider. °Document Revised: 02/05/2019 Document Reviewed: 02/05/2019 °Elsevier Patient Education © 2020 [employer] ° °

## 2019-11-05 ENCOUNTER — Encounter: Payer: Self-pay | Admitting: Certified Nurse Midwife

## 2019-11-05 ENCOUNTER — Ambulatory Visit (INDEPENDENT_AMBULATORY_CARE_PROVIDER_SITE_OTHER): Payer: Medicaid Other | Admitting: Certified Nurse Midwife

## 2019-11-05 ENCOUNTER — Other Ambulatory Visit: Payer: Self-pay

## 2019-11-05 VITALS — BP 95/64 | HR 86 | Wt 210.3 lb

## 2019-11-05 DIAGNOSIS — Z3685 Encounter for antenatal screening for Streptococcus B: Secondary | ICD-10-CM

## 2019-11-05 DIAGNOSIS — Z113 Encounter for screening for infections with a predominantly sexual mode of transmission: Secondary | ICD-10-CM

## 2019-11-05 DIAGNOSIS — Z3483 Encounter for supervision of other normal pregnancy, third trimester: Secondary | ICD-10-CM

## 2019-11-05 DIAGNOSIS — Z3A36 36 weeks gestation of pregnancy: Secondary | ICD-10-CM

## 2019-11-05 LAB — POCT URINALYSIS DIPSTICK OB
Bilirubin, UA: NEGATIVE
Blood, UA: NEGATIVE
Glucose, UA: NEGATIVE
Ketones, UA: NEGATIVE
Nitrite, UA: NEGATIVE
POC,PROTEIN,UA: NEGATIVE
Spec Grav, UA: 1.01 (ref 1.010–1.025)
Urobilinogen, UA: 0.2 E.U./dL
pH, UA: 7.5 (ref 5.0–8.0)

## 2019-11-05 NOTE — Patient Instructions (Addendum)
Fetal Movement Counts Patient Name: ________________________________________________ Patient Due Date: ____________________ What is a fetal movement count?  A fetal movement count is the number of times that you feel your baby move during a certain amount of time. This may also be called a fetal kick count. A fetal movement count is recommended for every pregnant woman. You may be asked to start counting fetal movements as early as week 28 of your pregnancy. Pay attention to when your baby is most active. You may notice your baby's sleep and wake cycles. You may also notice things that make your baby move more. You should do a fetal movement count:  When your baby is normally most active.  At the same time each day. A good time to count movements is while you are resting, after having something to eat and drink. How do I count fetal movements? 1. Find a quiet, comfortable area. Sit, or lie down on your side. 2. Write down the date, the start time and stop time, and the number of movements that you felt between those two times. Take this information with you to your health care visits. 3. Write down your start time when you feel the first movement. 4. Count kicks, flutters, swishes, rolls, and jabs. You should feel at least 10 movements. 5. You may stop counting after you have felt 10 movements, or if you have been counting for 2 hours. Write down the stop time. 6. If you do not feel 10 movements in 2 hours, contact your health care provider for further instructions. Your health care provider may want to do additional tests to assess your baby's well-being. Contact a health care provider if:  You feel fewer than 10 movements in 2 hours.  Your baby is not moving like he or she usually does. Date: ____________ Start time: ____________ Stop time: ____________ Movements: ____________ Date: ____________ Start time: ____________ Stop time: ____________ Movements: ____________ Date: ____________  Start time: ____________ Stop time: ____________ Movements: ____________ Date: ____________ Start time: ____________ Stop time: ____________ Movements: ____________ Date: ____________ Start time: ____________ Stop time: ____________ Movements: ____________ Date: ____________ Start time: ____________ Stop time: ____________ Movements: ____________ Date: ____________ Start time: ____________ Stop time: ____________ Movements: ____________ Date: ____________ Start time: ____________ Stop time: ____________ Movements: ____________ Date: ____________ Start time: ____________ Stop time: ____________ Movements: ____________ This information is not intended to replace advice given to you by your health care provider. Make sure you discuss any questions you have with your health care provider. Document Revised: 03/01/2019 Document Reviewed: 03/01/2019 Elsevier Patient Education  [place].   Pain Relief During Labor and Delivery Many things can cause pain during labor and delivery, including:  Pressure on bones and ligaments due to the baby moving through the pelvis.  Stretching of tissues due to the baby moving through the birth canal.  Muscle tension due to anxiety or nervousness.  The uterus tightening (contracting) and relaxing to help move the baby. There are many ways to deal with the pain of labor and delivery. They include:  Taking prenatal classes. Taking these classes helps you know what to expect during your baby's birth. What you learn will increase your confidence and decrease your anxiety.  Practicing relaxation techniques or doing relaxing activities, such as: ? Focused breathing. ? Meditation. ? Visualization. ? Aroma therapy. ? Listening to your favorite music. ? Hypnosis.  Taking a warm shower or bath (hydrotherapy). This may: ? Provide comfort and relaxation. ? Lessen your perception of pain. ?  Decrease the amount of pain medicine needed. ? Decrease the length  of labor.  Getting a massage or counterpressure on your back.  Applying warm packs or ice packs.  Changing positions often, moving around, or using a birthing ball.  Getting: ? Pain medicine through an IV or injection into a muscle. ? Pain medicine inserted into your spinal column. ? Injections of sterile water just under the skin on your lower back (intradermal injections). ? Laughing gas (nitrous oxide). Discuss your pain control options with your health care provider during your prenatal visits. Explore the options offered by your hospital or birth center. What kinds of medicine are available? There are two kinds of medicines that can be used to relieve pain during labor and delivery:  Analgesics. These medicines decrease pain without causing you to lose feeling or the ability to move your muscles.  Anesthetics. These medicines block feeling in the body and can decrease your ability to move freely. Both of these kinds of medicine can cause minor side effects, such as nausea, trouble concentrating, and sleepiness. They can also decrease the baby's heart rate before birth and affect the baby's breathing rate after birth. For this reason, health care providers are careful about when and how much medicine is given. What are specific medicines and procedures that provide pain relief? Local Anesthetics Local anesthetics are used to numb a small area of the body. They may be used along with another kind of anesthetic or used to numb the nerves of the vagina, cervix, and perineum during the second stage of labor. General Anesthetics General anesthetics cause you to lose consciousness so you do not feel pain. They are usually only used for an emergency cesarean delivery. General anesthetics are given through an IV tube and a mask. Pudendal Block A pudendal block is a form of local anesthetic. It may be used to relieve the pain associated with pushing or stretching of the perineum at the time of  delivery or to further numb the perineum. A pudendal block is done by injecting numbing medicine through the vaginal wall into a nerve in the pelvis. Epidural Analgesia Epidural analgesia is given through a flexible IV catheter that is inserted into the lower back. Numbing medicine is delivered continuously to the area near your spinal column nerves (epidural space). After having this type of analgesia, you may be able to move your legs but you most likely will not be able to walk. Depending on the amount of medicine given, you may lose all feeling in the lower half of your body, or you may retain some level of sensation, including the urge to push. Epidural analgesia can be used to provide pain relief for a vaginal birth. Spinal Block A spinal block is similar to epidural analgesia, but the medicine is injected into the spinal fluid instead of the epidural space. A spinal block is only given once. It starts to relieve pain quickly, but the pain relief lasts only 1-6 hours. Spinal blocks can be used for cesarean deliveries. Combined Spinal-Epidural (CSE) Block A CSE block combines the effects of a spinal block and epidural analgesia. The spinal block works quickly to block all pain. The epidural analgesia provides continuous pain relief, even after the effects of the spinal block have worn off. This information is not intended to replace advice given to you by your health care provider. Make sure you discuss any questions you have with your health care provider. Document Revised: 06/24/2017 Document Reviewed: 12/03/2015 Elsevier Patient Education  [place] B Streptococcus Test During Pregnancy Why am I having this test? Routine testing, also called screening, for group B streptococcus (GBS) is recommended for all pregnant women between the 36th and 37th week of pregnancy. GBS is a type of bacteria that can be passed from mother to baby during childbirth. Screening will help guide  whether or not you will need treatment during labor and delivery to prevent complications such as:  An infection in your uterus during labor.  An infection in your uterus after delivery.  A serious infection in your baby after delivery, such as pneumonia, meningitis, or sepsis. GBS screening is not often done before 36 weeks of pregnancy unless you go into labor prematurely. What happens if I have group B streptococcus? If testing shows that you have GBS, your health care provider will recommend treatment with IV antibiotics during labor and delivery. This treatment significantly decreases the risk of complications for you and your baby. If you have a planned C-section and you have GBS, you may not need to be treated with antibiotics because GBS is usually passed to babies after labor starts and your water breaks. If you are in labor or your water breaks before your C-section, it is possible for GBS to get into your uterus and be passed to your baby, so you might need treatment. Is there a chance I may not need to be tested? You may not need to be tested for GBS if:  You have a urine test that shows GBS before 36 to 37 weeks.  You had a baby with GBS infection after a previous delivery. In these cases, you will automatically be treated for GBS during labor and delivery. What is being tested? This test is done to check if you have group B streptococcus in your vagina or rectum. What kind of sample is taken? To collect samples for this test, your health care provider will swab your vagina and rectum with a cotton swab. The sample is then sent to the lab to see if GBS is present. What happens during the test?   You will remove your clothing from the waist down.  You will lie down on an exam table in the same position as you would for a pelvic exam.  Your health care provider will swab your vagina and rectum to collect samples for a culture test.  You will be able to go home after the test  and do all your usual activities. How are the results reported? The test results are reported as positive or negative. What do the results mean?  A positive test means you are at risk for passing GBS to your baby during labor and delivery. Your health care provider will recommend that you are treated with an IV antibiotic during labor and delivery.  A negative test means you are at very low risk of passing GBS to your baby. There is still a low risk of passing GBS to your baby because sometimes test results may report that you do not have a condition when you do (false-negative result) or there is a chance that you may become infected with GBS after the test is done. You most likely will not need to be treated with an antibiotic during labor and delivery. Talk with your health care provider about what your results mean. Questions to ask your health care provider Ask your health care provider, or the department that is doing the test:  When will my results  be ready?  How will I get my results?  What are my treatment options? Summary  Routine testing (screening) for group B streptococcus (GBS) is recommended for all pregnant women between the 36th and 37th week of pregnancy.  GBS is a type of bacteria that can be passed from mother to baby during childbirth.  If testing shows that you have GBS, your health care provider will recommend that you are treated with IV antibiotics during labor and delivery. This treatment almost always prevents infection in newborns. This information is not intended to replace advice given to you by your health care provider. Make sure you discuss any questions you have with your health care provider. Document Revised: 11/02/2018 Document Reviewed: 08/09/2018 Elsevier Patient Education  [place] of Pregnancy  The third trimester is from week 28 through week 40 (months 7 through 9). This trimester is when your unborn baby (fetus)  is growing very fast. At the end of the ninth month, the unborn baby is about 20 inches in length. It weighs about 6-10 pounds. Follow these instructions at home: Medicines  Take over-the-counter and prescription medicines only as told by your doctor. Some medicines are safe and some medicines are not safe during pregnancy.  Take a prenatal vitamin that contains at least 600 micrograms (mcg) of folic acid.  If you have trouble pooping (constipation), take medicine that will make your stool soft (stool softener) if your doctor approves. Eating and drinking   Eat regular, healthy meals.  Avoid raw meat and uncooked cheese.  If you get low calcium from the food you eat, talk to your doctor about taking a daily calcium supplement.  Eat four or five small meals rather than three large meals a day.  Avoid foods that are high in fat and sugars, such as fried and sweet foods.  To prevent constipation: ? Eat foods that are high in fiber, like fresh fruits and vegetables, whole grains, and beans. ? Drink enough fluids to keep your pee (urine) clear or pale yellow. Activity  Exercise only as told by your doctor. Stop exercising if you start to have cramps.  Avoid heavy lifting, wear low heels, and sit up straight.  Do not exercise if it is too hot, too humid, or if you are in a place of great height (high altitude).  You may continue to have sex unless your doctor tells you not to. Relieving pain and discomfort  Wear a good support bra if your breasts are tender.  Take frequent breaks and rest with your legs raised if you have leg cramps or low back pain.  Take warm water baths (sitz baths) to soothe pain or discomfort caused by hemorrhoids. Use hemorrhoid cream if your doctor approves.  If you develop puffy, bulging veins (varicose veins) in your legs: ? Wear support hose or compression stockings as told by your doctor. ? Raise (elevate) your feet for 15 minutes, 3-4 times a  day. ? Limit salt in your food. Safety  Wear your seat belt when driving.  Make a list of emergency phone numbers, including numbers for family, friends, the hospital, and police and fire departments. Preparing for your baby's arrival To prepare for the arrival of your baby:  Take prenatal classes.  Practice driving to the hospital.  Visit the hospital and tour the maternity area.  Talk to your work about taking leave once the baby comes.  Pack your hospital bag.  Prepare the baby's room.  Go to your doctor visits.  Buy a rear-facing car seat. Learn how to install it in your car. General instructions  Do not use hot tubs, steam rooms, or saunas.  Do not use any products that contain nicotine or tobacco, such as cigarettes and e-cigarettes. If you need help quitting, ask your doctor.  Do not drink alcohol.  Do not douche or use tampons or scented sanitary pads.  Do not cross your legs for long periods of time.  Do not travel for long distances unless you must. Only do so if your doctor says it is okay.  Visit your dentist if you have not gone during your pregnancy. Use a soft toothbrush to brush your teeth. Be gentle when you floss.  Avoid cat litter boxes and soil used by cats. These carry germs that can cause birth defects in the baby and can cause a loss of your baby (miscarriage) or stillbirth.  Keep all your prenatal visits as told by your doctor. This is important. Contact a doctor if:  You are not sure if you are in labor or if your water has broken.  You are dizzy.  You have mild cramps or pressure in your lower belly.  You have a nagging pain in your belly area.  You continue to feel sick to your stomach, you throw up, or you have watery poop.  You have bad smelling fluid coming from your vagina.  You have pain when you pee. Get help right away if:  You have a fever.  You are leaking fluid from your vagina.  You are spotting or bleeding from  your vagina.  You have severe belly cramps or pain.  You lose or gain weight quickly.  You have trouble catching your breath and have chest pain.  You notice sudden or extreme puffiness (swelling) of your face, hands, ankles, feet, or legs.  You have not felt the baby move in over an hour.  You have severe headaches that do not go away with medicine.  You have trouble seeing.  You are leaking, or you are having a gush of fluid, from your vagina before you are 37 weeks.  You have regular belly spasms (contractions) before you are 37 weeks. Summary  The third trimester is from week 28 through week 40 (months 7 through 9). This time is when your unborn baby is growing very fast.  Follow your doctor's advice about medicine, food, and activity.  Get ready for the arrival of your baby by taking prenatal classes, getting all the baby items ready, preparing the baby's room, and visiting your doctor to be checked.  Get help right away if you are bleeding from your vagina, or you have chest pain and trouble catching your breath, or if you have not felt your baby move in over an hour. This information is not intended to replace advice given to you by your health care provider. Make sure you discuss any questions you have with your health care provider. Document Revised: 11/02/2018 Document Reviewed: 08/17/2016 Elsevier Patient Education  [place]. Common Medications Safe in Pregnancy  Acne:      Constipation:  Benzoyl Peroxide     Colace  Clindamycin      Dulcolax Suppository  Topica Erythromycin     Fibercon  Salicylic Acid      Metamucil         Miralax AVOID:        Senakot   Accutane    Cough:  Retin-A       Cough Drops  Tetracycline      Phenergan w/ Codeine if Rx  Minocycline      Robitussin (Plain &  DM)  Antibiotics:     Crabs/Lice:  Ceclor       RID  Cephalosporins    AVOID:  E-Mycins      Kwell  Keflex  Macrobid/Macrodantin   Diarrhea:  Penicillin      Kao-Pectate  Zithromax      Imodium AD         PUSH FLUIDS AVOID:       Cipro     Fever:  Tetracycline      Tylenol (Regular or Extra  Minocycline       Strength)  Levaquin      Extra Strength-Do not          Exceed 8 tabs/24 hrs Caffeine:        <268m/day (equiv. To 1 cup of coffee or  approx. 3 12 oz sodas)         Gas: Cold/Hayfever:       Gas-X  Benadryl      Mylicon  Claritin       Phazyme  **Claritin-D        Chlor-Trimeton    Headaches:  Dimetapp      ASA-Free Excedrin  Drixoral-Non-Drowsy     Cold Compress  Mucinex (Guaifenasin)     Tylenol (Regular or Extra  Sudafed/Sudafed-12 Hour     Strength)  **Sudafed PE Pseudoephedrine   Tylenol Cold & Sinus     Vicks Vapor Rub  Zyrtec  **AVOID if Problems With Blood Pressure         Heartburn: Avoid lying down for at least 1 hour after meals  Aciphex      Maalox     Rash:  Milk of Magnesia     Benadryl    Mylanta       1% Hydrocortisone Cream  Pepcid  Pepcid Complete   Sleep Aids:  Prevacid      Ambien   Prilosec       Benadryl  Rolaids       Chamomile Tea  Tums (Limit 4/day)     Unisom  Zantac       Tylenol PM         Warm milk-add vanilla or  Hemorrhoids:       Sugar for taste  Anusol/Anusol H.C.  (RX: Analapram 2.5%)  Sugar Substitutes:  Hydrocortisone OTC     Ok in moderation  Preparation H      Tucks        Vaseline lotion applied to tissue with wiping    Herpes:     Throat:  Acyclovir      Oragel  Famvir  Valtrex     Vaccines:         Flu Shot Leg Cramps:       *Gardasil  Benadryl      Hepatitis A         Hepatitis B Nasal Spray:       Pneumovax  Saline Nasal Spray     Polio Booster         Tetanus Nausea:       Tuberculosis test or PPD  Vitamin B6 25 mg TID   AVOID:    Dramamine      *Gardasil  Emetrol       Live  Poliovirus  Ginger Root 250 mg QID  MMR (measles, mumps &  High Complex Carbs @ Bedtime    rebella)  Sea Bands-Accupressure    Varicella (Chickenpox)  Unisom 1/2 tab TID     *No known complications           If received before Pain:         Known pregnancy;   Darvocet       Resume series after  Lortab        Delivery  Percocet    Yeast:   Tramadol      Femstat  Tylenol 3      Gyne-lotrimin  Ultram       Monistat  Vicodin           MISC:         All Sunscreens           Hair Coloring/highlights          Insect Repellant's          (Including DEET)         [place]stic Tans Breastfeeding  Choosing to breastfeed is one of the best decisions you can make for yourself and your baby. A change in hormones during pregnancy causes your breasts to make breast milk in your milk-producing glands. Hormones prevent breast milk from being released before your baby is born. They also prompt milk flow after birth. Once breastfeeding has begun, thoughts of your baby, as well as his or her sucking or crying, can stimulate the release of milk from your milk-producing glands. Benefits of breastfeeding Research shows that breastfeeding offers many health benefits for infants and mothers. It also offers a cost-free and convenient way to feed your baby. For your baby  Your first milk (colostrum) helps your baby's digestive system to function better.  Special cells in your milk (antibodies) help your baby to fight off infections.  Breastfed babies are less likely to develop asthma, allergies, obesity, or type 2 diabetes. They are also at lower risk for sudden infant death syndrome (SIDS).  Nutrients in breast milk are better able to meet your baby's needs compared to infant formula.  Breast milk improves your baby's brain development. For you  Breastfeeding helps to create a very special bond between you and your baby.  Breastfeeding is convenient. Breast milk costs nothing and is always available at the  correct temperature.  Breastfeeding helps to burn calories. It helps you to lose the weight that you gained during pregnancy.  Breastfeeding makes your uterus return faster to its size before pregnancy. It also slows bleeding (lochia) after you give birth.  Breastfeeding helps to lower your risk of developing type 2 diabetes, osteoporosis, rheumatoid arthritis, cardiovascular disease, and breast, ovarian, uterine, and endometrial cancer later in life. Breastfeeding basics Starting breastfeeding  Find a comfortable place to sit or lie down, with your neck and back well-supported.  Place a pillow or a rolled-up blanket under your baby to bring him or her to the level of your breast (if you are seated). Nursing pillows are specially designed to help support your arms and your baby while you breastfeed.  Make sure that your baby's tummy (abdomen) is facing your abdomen.  Gently massage your breast. With your fingertips, massage from the outer edges of your breast inward toward the nipple. This encourages milk flow. If your milk flows slowly, you may need to continue this action during the feeding.  Support your breast with 4 fingers underneath and your thumb above your nipple (make the letter "  C" with your hand). Make sure your fingers are well away from your nipple and your baby's mouth.  Stroke your baby's lips gently with your finger or nipple.  When your baby's mouth is open wide enough, quickly bring your baby to your breast, placing your entire nipple and as much of the areola as possible into your baby's mouth. The areola is the colored area around your nipple. ? More areola should be visible above your baby's upper lip than below the lower lip. ? Your baby's lips should be opened and extended outward (flanged) to ensure an adequate, comfortable latch. ? Your baby's tongue should be between his or her lower gum and your breast.  Make sure that your baby's mouth is correctly positioned  around your nipple (latched). Your baby's lips should create a seal on your breast and be turned out (everted).  It is common for your baby to suck about 2-3 minutes in order to start the flow of breast milk. Latching Teaching your baby how to latch onto your breast properly is very important. An improper latch can cause nipple pain, decreased milk supply, and poor weight gain in your baby. Also, if your baby is not latched onto your nipple properly, he or she may swallow some air during feeding. This can make your baby fussy. Burping your baby when you switch breasts during the feeding can help to get rid of the air. However, teaching your baby to latch on properly is still the best way to prevent fussiness from swallowing air while breastfeeding. Signs that your baby has successfully latched onto your nipple  Silent tugging or silent sucking, without causing you pain. Infant's lips should be extended outward (flanged).  Swallowing heard between every 3-4 sucks once your milk has started to flow (after your let-down milk reflex occurs).  Muscle movement above and in front of his or her ears while sucking. Signs that your baby has not successfully latched onto your nipple  Sucking sounds or smacking sounds from your baby while breastfeeding.  Nipple pain. If you think your baby has not latched on correctly, slip your finger into the corner of your baby's mouth to break the suction and place it between your baby's gums. Attempt to start breastfeeding again. Signs of successful breastfeeding Signs from your baby  Your baby will gradually decrease the number of sucks or will completely stop sucking.  Your baby will fall asleep.  Your baby's body will relax.  Your baby will retain a small amount of milk in his or her mouth.  Your baby will let go of your breast by himself or herself. Signs from you  Breasts that have increased in firmness, weight, and size 1-3 hours after  feeding.  Breasts that are softer immediately after breastfeeding.  Increased milk volume, as well as a change in milk consistency and color by the fifth day of breastfeeding.  Nipples that are not sore, cracked, or bleeding. Signs that your baby is getting enough milk  Wetting at least 1-2 diapers during the first 24 hours after birth.  Wetting at least 5-6 diapers every 24 hours for the first week after birth. The urine should be clear or pale yellow by the age of 5 days.  Wetting 6-8 diapers every 24 hours as your baby continues to grow and develop.  At least 3 stools in a 24-hour period by the age of 5 days. The stool should be soft and yellow.  At least 3 stools in a 24-hour  period by the age of 7 days. The stool should be seedy and yellow.  No loss of weight greater than 10% of birth weight during the first 3 days of life.  Average weight gain of 4-7 oz (113-198 g) per week after the age of 4 days.  Consistent daily weight gain by the age of 5 days, without weight loss after the age of 2 weeks. After a feeding, your baby may spit up a small amount of milk. This is normal. Breastfeeding frequency and duration Frequent feeding will help you make more milk and can prevent sore nipples and extremely full breasts (breast engorgement). Breastfeed when you feel the need to reduce the fullness of your breasts or when your baby shows signs of hunger. This is called "breastfeeding on demand." Signs that your baby is hungry include:  Increased alertness, activity, or restlessness.  Movement of the head from side to side.  Opening of the mouth when the corner of the mouth or cheek is stroked (rooting).  Increased sucking sounds, smacking lips, cooing, sighing, or squeaking.  Hand-to-mouth movements and sucking on fingers or hands.  Fussing or crying. Avoid introducing a pacifier to your baby in the first 4-6 weeks after your baby is born. After this time, you may choose to use a  pacifier. Research has shown that pacifier use during the first year of a baby's life decreases the risk of sudden infant death syndrome (SIDS). Allow your baby to feed on each breast as long as he or she wants. When your baby unlatches or falls asleep while feeding from the first breast, offer the second breast. Because newborns are often sleepy in the first few weeks of life, you may need to awaken your baby to get him or her to feed. Breastfeeding times will vary from baby to baby. However, the following rules can serve as a guide to help you make sure that your baby is properly fed:  Newborns (babies 55 weeks of age or younger) may breastfeed every 1-3 hours.  Newborns should not go without breastfeeding for longer than 3 hours during the day or 5 hours during the night.  You should breastfeed your baby a minimum of 8 times in a 24-hour period. Breast milk pumping     Pumping and storing breast milk allows you to make sure that your baby is exclusively fed your breast milk, even at times when you are unable to breastfeed. This is especially important if you go back to work while you are still breastfeeding, or if you are not able to be present during feedings. Your lactation consultant can help you find a method of pumping that works best for you and give you guidelines about how long it is safe to store breast milk. Caring for your breasts while you breastfeed Nipples can become dry, cracked, and sore while breastfeeding. The following recommendations can help keep your breasts moisturized and healthy:  Avoid using soap on your nipples.  Wear a supportive bra designed especially for nursing. Avoid wearing underwire-style bras or extremely tight bras (sports bras).  Air-dry your nipples for 3-4 minutes after each feeding.  Use only cotton bra pads to absorb leaked breast milk. Leaking of breast milk between feedings is normal.  Use lanolin on your nipples after breastfeeding. Lanolin  helps to maintain your skin's normal moisture barrier. Pure lanolin is not harmful (not toxic) to your baby. You may also hand express a few drops of breast milk and gently massage that milk  into your nipples and allow the milk to air-dry. In the first few weeks after giving birth, some women experience breast engorgement. Engorgement can make your breasts feel heavy, warm, and tender to the touch. Engorgement peaks within 3-5 days after you give birth. The following recommendations can help to ease engorgement:  Completely empty your breasts while breastfeeding or pumping. You may want to start by applying warm, moist heat (in the shower or with warm, water-soaked hand towels) just before feeding or pumping. This increases circulation and helps the milk flow. If your baby does not completely empty your breasts while breastfeeding, pump any extra milk after he or she is finished.  Apply ice packs to your breasts immediately after breastfeeding or pumping, unless this is too uncomfortable for you. To do this: ? Put ice in a plastic bag. ? Place a towel between your skin and the bag. ? Leave the ice on for 20 minutes, 2-3 times a day.  Make sure that your baby is latched on and positioned properly while breastfeeding. If engorgement persists after 48 hours of following these recommendations, contact your health care provider or a Science writer. Overall health care recommendations while breastfeeding  Eat 3 healthy meals and 3 snacks every day. Well-nourished mothers who are breastfeeding need an additional 450-500 calories a day. You can meet this requirement by increasing the amount of a balanced diet that you eat.  Drink enough water to keep your urine pale yellow or clear.  Rest often, relax, and continue to take your prenatal vitamins to prevent fatigue, stress, and low vitamin and mineral levels in your body (nutrient deficiencies).  Do not use any products that contain nicotine or  tobacco, such as cigarettes and e-cigarettes. Your baby may be harmed by chemicals from cigarettes that pass into breast milk and exposure to secondhand smoke. If you need help quitting, ask your health care provider.  Avoid alcohol.  Do not use illegal drugs or marijuana.  Talk with your health care provider before taking any medicines. These include over-the-counter and prescription medicines as well as vitamins and herbal supplements. Some medicines that may be harmful to your baby can pass through breast milk.  It is possible to become pregnant while breastfeeding. If birth control is desired, ask your health care provider about options that will be safe while breastfeeding your baby. Where to find more information: [employer]: www.llli.org Contact a health care provider if:  You feel like you want to stop breastfeeding or have become frustrated with breastfeeding.  Your nipples are cracked or bleeding.  Your breasts are red, tender, or warm.  You have: ? Painful breasts or nipples. ? A swollen area on either breast. ? A fever or chills. ? Nausea or vomiting. ? Drainage other than breast milk from your nipples.  Your breasts do not become full before feedings by the fifth day after you give birth.  You feel sad and depressed.  Your baby is: ? Too sleepy to eat well. ? Having trouble sleeping. ? More than 36 week old and wetting fewer than 6 diapers in a 24-hour period. ? Not gaining weight by 77 days of age.  Your baby has fewer than 3 stools in a 24-hour period.  Your baby's skin or the white parts of his or her eyes become yellow. Get help right away if:  Your baby is overly tired (lethargic) and does not want to wake up and feed.  Your baby develops an unexplained fever.  Summary  Breastfeeding offers many health benefits for infant and mothers.  Try to breastfeed your infant when he or she shows early signs of hunger.  Gently tickle or stroke  your baby's lips with your finger or nipple to allow the baby to open his or her mouth. Bring the baby to your breast. Make sure that much of the areola is in your baby's mouth. Offer one side and burp the baby before you offer the other side.  Talk with your health care provider or lactation consultant if you have questions or you face problems as you breastfeed. This information is not intended to replace advice given to you by your health care provider. Make sure you discuss any questions you have with your health care provider. Document Revised: 10/06/2017 Document Reviewed: 08/13/2016 Elsevier Patient Education  [place].

## 2019-11-05 NOTE — Progress Notes (Signed)
ROB-Doing well, no questions or concerns. Request SVE. 36 week cultures collected. Pre-labor checklist, herbal prep guide and birth affirmations given. Anticipatory guidance regarding course of prenatal care. Reviewed red flag symptoms and when to call. RTC x 1 week for ROB or sooner if needed.

## 2019-11-05 NOTE — Progress Notes (Signed)
ROB-Pt present for routine prenatal care and 36 week cultures. Pt stated that she was doing well no problems.

## 2019-11-06 ENCOUNTER — Ambulatory Visit
Admission: RE | Admit: 2019-11-06 | Discharge: 2019-11-06 | Disposition: A | Discharge status: Home / Self Care | Payer: Medicaid Other | Source: Ambulatory Visit | Attending: Certified Nurse Midwife | Admitting: Certified Nurse Midwife

## 2019-11-06 NOTE — Lactation Note (Signed)
Lactation Consultation Note  Patient Name: Tracy Patel Date: 11/06/2019     Maternal Data  Any states that she had a "fast" labor and delivery but her breastfeeding journey did not go well. She had Mastitis twice with her first child. After the second Mastitis diagnosis [name] states she gave up on breastfeeding or pumping. She states she had inverted nipples but feels the breast shells didn't work for her. After her daughter was born she states her nipples everted.   Feeding    LATCH Score                   Interventions    Lactation Tools Discussed/Used     Consult Status  Tracy Patel is visiting today for a prenatal lactation consultation. She has concerns of not being able to produce sufficient milk for her baby. LC educated Tracy Patel on what to expect in the first 24 hours after the birth. Tracy Patel was also educated on the [employer]. She states that she has taken a virtual breastfeeding class. Tracy Patel was able to meet Tracy Patel, the [facility] that would possibly be working with her and her baby after delivery to get breastfeeding off to a good start. LC educated on the signs and symptoms of mastitis, how to treat and when to notify her Midwife.  Lactation Plan: LC will ensure that baby Tracy Patel has a good latch and is transferring milk effectively If baby Tracy Patel is not transferring milk effectively, use of an electric pump to establish milk supply.    Tracy Patel 11/06/2019, 9:48 AM

## 2019-11-07 LAB — GC/CHLAMYDIA PROBE AMP
Chlamydia trachomatis, NAA: NEGATIVE
Neisseria Gonorrhoeae by PCR: NEGATIVE

## 2019-11-07 LAB — STREP GP B NAA: Strep Gp B NAA: NEGATIVE

## 2019-11-12 ENCOUNTER — Other Ambulatory Visit: Payer: Self-pay

## 2019-11-12 ENCOUNTER — Ambulatory Visit (INDEPENDENT_AMBULATORY_CARE_PROVIDER_SITE_OTHER): Payer: Medicaid Other | Admitting: Certified Nurse Midwife

## 2019-11-12 VITALS — BP 100/67 | HR 87 | Wt 214.1 lb

## 2019-11-12 DIAGNOSIS — Z3A37 37 weeks gestation of pregnancy: Secondary | ICD-10-CM | POA: Diagnosis not present

## 2019-11-12 LAB — POCT URINALYSIS DIPSTICK OB
Bilirubin, UA: NEGATIVE
Blood, UA: NEGATIVE
Glucose, UA: NEGATIVE
Ketones, UA: NEGATIVE
Leukocytes, UA: NEGATIVE
Nitrite, UA: NEGATIVE
POC,PROTEIN,UA: NEGATIVE
Spec Grav, UA: 1.01 (ref 1.010–1.025)
Urobilinogen, UA: 0.2 E.U./dL
pH, UA: 5 (ref 5.0–8.0)

## 2019-11-12 NOTE — Patient Instructions (Signed)
Braxton Hicks Contractions °Contractions of the uterus can occur throughout pregnancy, but they are not always a sign that you are in labor. You may have practice contractions called Braxton Hicks contractions. These false labor contractions are sometimes confused with true labor. °What are Braxton Hicks contractions? °Braxton Hicks contractions are tightening movements that occur in the muscles of the uterus before labor. Unlike true labor contractions, these contractions do not result in opening (dilation) and thinning of the cervix. Toward the end of pregnancy (32-34 weeks), Braxton Hicks contractions can happen more often and may become stronger. These contractions are sometimes difficult to tell apart from true labor because they can be very uncomfortable. You should not feel embarrassed if you go to the hospital with false labor. °Sometimes, the only way to tell if you are in true labor is for your health care provider to look for changes in the cervix. The health care provider will do a physical exam and may monitor your contractions. If you are not in true labor, the exam should show that your cervix is not dilating and your water has not broken. °If there are no other health problems associated with your pregnancy, it is completely safe for you to be sent home with false labor. You may continue to have Braxton Hicks contractions until you go into true labor. °How to tell the difference between true labor and false labor °True labor °· Contractions last 30-70 seconds. °· Contractions become very regular. °· Discomfort is usually felt in the top of the uterus, and it spreads to the lower abdomen and low back. °· Contractions do not go away with walking. °· Contractions usually become more intense and increase in frequency. °· The cervix dilates and gets thinner. °False labor °· Contractions are usually shorter and not as strong as true labor contractions. °· Contractions are usually irregular. °· Contractions  are often felt in the front of the lower abdomen and in the groin. °· Contractions may go away when you walk around or change positions while lying down. °· Contractions get weaker and are shorter-lasting as time goes on. °· The cervix usually does not dilate or become thin. °Follow these instructions at home: ° °· Take over-the-counter and prescription medicines only as told by your health care provider. °· Keep up with your usual exercises and follow other instructions from your health care provider. °· Eat and drink lightly if you think you are going into labor. °· If Braxton Hicks contractions are making you uncomfortable: °? Change your position from lying down or resting to walking, or change from walking to resting. °? Sit and rest in a tub of warm water. °? Drink enough fluid to keep your urine pale yellow. Dehydration may cause these contractions. °? Do slow and deep breathing several times an hour. °· Keep all follow-up prenatal visits as told by your health care provider. This is important. °Contact a health care provider if: °· You have a fever. °· You have continuous pain in your abdomen. °Get help right away if: °· Your contractions become stronger, more regular, and closer together. °· You have fluid leaking or gushing from your vagina. °· You pass blood-tinged mucus (bloody show). °· You have bleeding from your vagina. °· You have low back pain that you never had before. °· You feel your baby’s head pushing down and causing pelvic pressure. °· Your baby is not moving inside you as much as it used to. °Summary °· Contractions that occur before labor are   called Braxton Hicks contractions, false labor, or practice contractions. °· Braxton Hicks contractions are usually shorter, weaker, farther apart, and less regular than true labor contractions. True labor contractions usually become progressively stronger and regular, and they become more frequent. °· Manage discomfort from Braxton Hicks contractions  by changing position, resting in a warm bath, drinking plenty of water, or practicing deep breathing. °This information is not intended to replace advice given to you by your health care provider. Make sure you discuss any questions you have with your health care provider. °Document Revised: 06/24/2017 Document Reviewed: 11/25/2016 °Elsevier Patient Education © 2020 Elsevier Inc. ° °

## 2019-11-12 NOTE — Progress Notes (Signed)
Body mass index is 36.75 kg/m. ROB doing well. Feels good fetal movement. Labor precautions reviewed. Follow up 1 wk with Marcelino Duster.   Doreene Burke, CNM

## 2019-11-19 ENCOUNTER — Ambulatory Visit (INDEPENDENT_AMBULATORY_CARE_PROVIDER_SITE_OTHER): Payer: Medicaid Other | Admitting: Certified Nurse Midwife

## 2019-11-19 ENCOUNTER — Other Ambulatory Visit: Payer: Self-pay

## 2019-11-19 VITALS — BP 111/67 | HR 85 | Wt 216.5 lb

## 2019-11-19 DIAGNOSIS — Z3483 Encounter for supervision of other normal pregnancy, third trimester: Secondary | ICD-10-CM

## 2019-11-19 DIAGNOSIS — Z3A38 38 weeks gestation of pregnancy: Secondary | ICD-10-CM

## 2019-11-19 LAB — POCT URINALYSIS DIPSTICK OB
Bilirubin, UA: NEGATIVE
Blood, UA: NEGATIVE
Glucose, UA: NEGATIVE
Ketones, UA: NEGATIVE
Leukocytes, UA: NEGATIVE
Nitrite, UA: NEGATIVE
Spec Grav, UA: 1.01 (ref 1.010–1.025)
Urobilinogen, UA: 0.2 E.U./dL
pH, UA: 7 (ref 5.0–8.0)

## 2019-11-19 NOTE — Progress Notes (Signed)
ROB- Doing well.  She reports irregular contractions but is feeling more pressure. Cervical exam done by CNM and unchanged from prior. She is doing her herbal prep. Anticipatory guidance given. Reviewed red flag symptoms or when to call. RTC x 1 week or sooner if needed  Glorious Peach [facility]  Gunnar Bulla CNM [facility] 11/19/19 12:39 PM

## 2019-11-19 NOTE — Progress Notes (Signed)
Patient comes in today for ROB visit. She has no concerns today.  

## 2019-11-19 NOTE — Patient Instructions (Signed)
Fetal Movement Counts Patient Name: ________________________________________________ Patient Due Date: ____________________ What is a fetal movement count?  A fetal movement count is the number of times that you feel your baby move during a certain amount of time. This may also be called a fetal kick count. A fetal movement count is recommended for every pregnant woman. You may be asked to start counting fetal movements as early as week 28 of your pregnancy. Pay attention to when your baby is most active. You may notice your baby's sleep and wake cycles. You may also notice things that make your baby move more. You should do a fetal movement count:  When your baby is normally most active.  At the same time each day. A good time to count movements is while you are resting, after having something to eat and drink. How do I count fetal movements? 1. Find a quiet, comfortable area. Sit, or lie down on your side. 2. Write down the date, the start time and stop time, and the number of movements that you felt between those two times. Take this information with you to your health care visits. 3. Write down your start time when you feel the first movement. 4. Count kicks, flutters, swishes, rolls, and jabs. You should feel at least 10 movements. 5. You may stop counting after you have felt 10 movements, or if you have been counting for 2 hours. Write down the stop time. 6. If you do not feel 10 movements in 2 hours, contact your health care provider for further instructions. Your health care provider may want to do additional tests to assess your baby's well-being. Contact a health care provider if:  You feel fewer than 10 movements in 2 hours.  Your baby is not moving like he or she usually does. Date: ____________ Start time: ____________ Stop time: ____________ Movements: ____________ Date: ____________ Start time: ____________ Stop time: ____________ Movements: ____________ Date: ____________  Start time: ____________ Stop time: ____________ Movements: ____________ Date: ____________ Start time: ____________ Stop time: ____________ Movements: ____________ Date: ____________ Start time: ____________ Stop time: ____________ Movements: ____________ Date: ____________ Start time: ____________ Stop time: ____________ Movements: ____________ Date: ____________ Start time: ____________ Stop time: ____________ Movements: ____________ Date: ____________ Start time: ____________ Stop time: ____________ Movements: ____________ Date: ____________ Start time: ____________ Stop time: ____________ Movements: ____________ This information is not intended to replace advice given to you by your health care provider. Make sure you discuss any questions you have with your health care provider. Document Revised: 03/01/2019 Document Reviewed: 03/01/2019 Elsevier Patient Education  2020 Elsevier Inc.  

## 2019-11-26 ENCOUNTER — Other Ambulatory Visit: Payer: Self-pay

## 2019-11-26 ENCOUNTER — Ambulatory Visit (INDEPENDENT_AMBULATORY_CARE_PROVIDER_SITE_OTHER): Payer: Medicaid Other | Admitting: Certified Nurse Midwife

## 2019-11-26 VITALS — BP 98/68 | HR 85 | Wt 220.1 lb

## 2019-11-26 DIAGNOSIS — Z3A39 39 weeks gestation of pregnancy: Secondary | ICD-10-CM

## 2019-11-26 LAB — POCT URINALYSIS DIPSTICK OB
Bilirubin, UA: NEGATIVE
Blood, UA: NEGATIVE
Glucose, UA: NEGATIVE
Ketones, UA: NEGATIVE
Leukocytes, UA: NEGATIVE
Nitrite, UA: NEGATIVE
POC,PROTEIN,UA: NEGATIVE
Spec Grav, UA: 1.025 (ref 1.010–1.025)
Urobilinogen, UA: 0.2 E.U./dL
pH, UA: 5 (ref 5.0–8.0)

## 2019-11-26 NOTE — Patient Instructions (Signed)
Braxton Hicks Contractions °Contractions of the uterus can occur throughout pregnancy, but they are not always a sign that you are in labor. You may have practice contractions called Braxton Hicks contractions. These false labor contractions are sometimes confused with true labor. °What are Braxton Hicks contractions? °Braxton Hicks contractions are tightening movements that occur in the muscles of the uterus before labor. Unlike true labor contractions, these contractions do not result in opening (dilation) and thinning of the cervix. Toward the end of pregnancy (32-34 weeks), Braxton Hicks contractions can happen more often and may become stronger. These contractions are sometimes difficult to tell apart from true labor because they can be very uncomfortable. You should not feel embarrassed if you go to the hospital with false labor. °Sometimes, the only way to tell if you are in true labor is for your health care provider to look for changes in the cervix. The health care provider will do a physical exam and may monitor your contractions. If you are not in true labor, the exam should show that your cervix is not dilating and your water has not broken. °If there are no other health problems associated with your pregnancy, it is completely safe for you to be sent home with false labor. You may continue to have Braxton Hicks contractions until you go into true labor. °How to tell the difference between true labor and false labor °True labor °· Contractions last 30-70 seconds. °· Contractions become very regular. °· Discomfort is usually felt in the top of the uterus, and it spreads to the lower abdomen and low back. °· Contractions do not go away with walking. °· Contractions usually become more intense and increase in frequency. °· The cervix dilates and gets thinner. °False labor °· Contractions are usually shorter and not as strong as true labor contractions. °· Contractions are usually irregular. °· Contractions  are often felt in the front of the lower abdomen and in the groin. °· Contractions may go away when you walk around or change positions while lying down. °· Contractions get weaker and are shorter-lasting as time goes on. °· The cervix usually does not dilate or become thin. °Follow these instructions at home: ° °· Take over-the-counter and prescription medicines only as told by your health care provider. °· Keep up with your usual exercises and follow other instructions from your health care provider. °· Eat and drink lightly if you think you are going into labor. °· If Braxton Hicks contractions are making you uncomfortable: °? Change your position from lying down or resting to walking, or change from walking to resting. °? Sit and rest in a tub of warm water. °? Drink enough fluid to keep your urine pale yellow. Dehydration may cause these contractions. °? Do slow and deep breathing several times an hour. °· Keep all follow-up prenatal visits as told by your health care provider. This is important. °Contact a health care provider if: °· You have a fever. °· You have continuous pain in your abdomen. °Get help right away if: °· Your contractions become stronger, more regular, and closer together. °· You have fluid leaking or gushing from your vagina. °· You pass blood-tinged mucus (bloody show). °· You have bleeding from your vagina. °· You have low back pain that you never had before. °· You feel your baby’s head pushing down and causing pelvic pressure. °· Your baby is not moving inside you as much as it used to. °Summary °· Contractions that occur before labor are   called Braxton Hicks contractions, false labor, or practice contractions. °· Braxton Hicks contractions are usually shorter, weaker, farther apart, and less regular than true labor contractions. True labor contractions usually become progressively stronger and regular, and they become more frequent. °· Manage discomfort from Braxton Hicks contractions  by changing position, resting in a warm bath, drinking plenty of water, or practicing deep breathing. °This information is not intended to replace advice given to you by your health care provider. Make sure you discuss any questions you have with your health care provider. °Document Revised: 06/24/2017 Document Reviewed: 11/25/2016 °Elsevier Patient Education © 2020 Elsevier Inc. ° °

## 2019-11-26 NOTE — Progress Notes (Signed)
ROB doing well. Feels fetal movement. Discussed labor precautions. U/s next visit for growth & AFI. Discussed induction at 41 wks if labor has not occurred. She verbalizes and agrees to plan . Follow up 1 wk with Marcelino Duster.   Doreene Burke, CNM

## 2019-12-01 ENCOUNTER — Inpatient Hospital Stay: Admit: 2019-12-01 | Payer: Self-pay

## 2019-12-03 ENCOUNTER — Other Ambulatory Visit: Payer: Self-pay

## 2019-12-03 ENCOUNTER — Other Ambulatory Visit: Payer: Medicaid Other

## 2019-12-03 ENCOUNTER — Inpatient Hospital Stay
Admission: EM | Admit: 2019-12-03 | Discharge: 2019-12-05 | DRG: 807 | Disposition: A | Discharge status: Home / Self Care | Payer: Medicaid Other | Attending: Certified Nurse Midwife | Admitting: Certified Nurse Midwife

## 2019-12-03 ENCOUNTER — Encounter: Payer: Self-pay | Admitting: Obstetrics and Gynecology

## 2019-12-03 ENCOUNTER — Encounter: Payer: Medicaid Other | Admitting: Certified Nurse Midwife

## 2019-12-03 DIAGNOSIS — Z20822 Contact with and (suspected) exposure to covid-19: Secondary | ICD-10-CM | POA: Diagnosis present

## 2019-12-03 DIAGNOSIS — O9952 Diseases of the respiratory system complicating childbirth: Secondary | ICD-10-CM | POA: Diagnosis present

## 2019-12-03 DIAGNOSIS — R0989 Other specified symptoms and signs involving the circulatory and respiratory systems: Secondary | ICD-10-CM

## 2019-12-03 DIAGNOSIS — J45909 Unspecified asthma, uncomplicated: Secondary | ICD-10-CM | POA: Diagnosis present

## 2019-12-03 DIAGNOSIS — O4292 Full-term premature rupture of membranes, unspecified as to length of time between rupture and onset of labor: Secondary | ICD-10-CM | POA: Diagnosis present

## 2019-12-03 DIAGNOSIS — R52 Pain, unspecified: Secondary | ICD-10-CM

## 2019-12-03 DIAGNOSIS — Z3A40 40 weeks gestation of pregnancy: Secondary | ICD-10-CM | POA: Diagnosis not present

## 2019-12-03 LAB — CBC
HCT: 36.4 % (ref 36.0–46.0)
Hemoglobin: 12.2 g/dL (ref 12.0–15.0)
MCH: 29.7 pg (ref 26.0–34.0)
MCHC: 33.5 g/dL (ref 30.0–36.0)
MCV: 88.6 fL (ref 80.0–100.0)
Platelets: 244 10*3/uL (ref 150–400)
RBC: 4.11 MIL/uL (ref 3.87–5.11)
RDW: 15.5 % (ref 11.5–15.5)
WBC: 6.9 10*3/uL (ref 4.0–10.5)
nRBC: 0 % (ref 0.0–0.2)

## 2019-12-03 LAB — SARS CORONAVIRUS 2 BY RT PCR (HOSPITAL ORDER, PERFORMED IN ~~LOC~~ HOSPITAL LAB): SARS Coronavirus 2: NEGATIVE

## 2019-12-03 LAB — ABO/RH: ABO/RH(D): B POS

## 2019-12-03 LAB — TYPE AND SCREEN
ABO/RH(D): B POS
Antibody Screen: NEGATIVE

## 2019-12-03 LAB — RUPTURE OF MEMBRANE (ROM)PLUS: Rom Plus: POSITIVE

## 2019-12-03 MED ORDER — BENZOCAINE-MENTHOL 20-0.5 % EX AERO
1.0000 "application " | INHALATION_SPRAY | CUTANEOUS | Status: DC | PRN
  Administered 2019-12-03: 1 via TOPICAL
  Filled 2019-12-03 (×2): qty 56

## 2019-12-03 MED ORDER — TERBUTALINE SULFATE 1 MG/ML IJ SOLN
0.2500 mg | Freq: Once | INTRAMUSCULAR | Status: DC | PRN
  Filled 2019-12-03: qty 1

## 2019-12-03 MED ORDER — DIBUCAINE (PERIANAL) 1 % EX OINT
1.0000 "application " | TOPICAL_OINTMENT | CUTANEOUS | Status: DC | PRN
  Administered 2019-12-03: 1 via RECTAL
  Filled 2019-12-03: qty 28

## 2019-12-03 MED ORDER — IBUPROFEN 600 MG PO TABS
600.0000 mg | ORAL_TABLET | Freq: Four times a day (QID) | ORAL | Status: DC
  Administered 2019-12-03 – 2019-12-05 (×7): 600 mg via ORAL
  Filled 2019-12-03 (×9): qty 1

## 2019-12-03 MED ORDER — SOD CITRATE-CITRIC ACID 500-334 MG/5ML PO SOLN: 30.0000 mL | ORAL | Status: DC | PRN

## 2019-12-03 MED ORDER — METHYLERGONOVINE MALEATE 0.2 MG PO TABS
0.2000 mg | ORAL_TABLET | ORAL | Status: DC | PRN
  Filled 2019-12-03: qty 1

## 2019-12-03 MED ORDER — OXYTOCIN 40 UNITS IN NORMAL SALINE INFUSION - SIMPLE MED
2.5000 [IU]/h | INTRAVENOUS | Status: DC
  Administered 2019-12-03: 2.5 [IU]/h via INTRAVENOUS

## 2019-12-03 MED ORDER — ONDANSETRON HCL 4 MG PO TABS: 4.0000 mg | ORAL_TABLET | ORAL | Status: DC | PRN

## 2019-12-03 MED ORDER — DOCUSATE SODIUM 100 MG PO CAPS
100.0000 mg | ORAL_CAPSULE | Freq: Two times a day (BID) | ORAL | Status: DC
  Administered 2019-12-04 – 2019-12-05 (×3): 100 mg via ORAL
  Filled 2019-12-03 (×4): qty 1

## 2019-12-03 MED ORDER — ACETAMINOPHEN 325 MG PO TABS: 650.0000 mg | ORAL_TABLET | ORAL | Status: DC | PRN

## 2019-12-03 MED ORDER — ONDANSETRON HCL 4 MG/2ML IJ SOLN: 4.0000 mg | INTRAMUSCULAR | Status: DC | PRN

## 2019-12-03 MED ORDER — OXYTOCIN 10 UNIT/ML IJ SOLN
INTRAMUSCULAR | Status: AC
  Filled 2019-12-03: qty 2

## 2019-12-03 MED ORDER — PRENATAL MULTIVITAMIN CH
1.0000 | ORAL_TABLET | Freq: Every day | ORAL | Status: DC
  Administered 2019-12-03 – 2019-12-05 (×3): 1 via ORAL
  Filled 2019-12-03 (×3): qty 1

## 2019-12-03 MED ORDER — SIMETHICONE 80 MG PO CHEW: 80.0000 mg | CHEWABLE_TABLET | ORAL | Status: DC | PRN

## 2019-12-03 MED ORDER — OXYCODONE-ACETAMINOPHEN 5-325 MG PO TABS: 1.0000 | ORAL_TABLET | ORAL | Status: DC | PRN

## 2019-12-03 MED ORDER — LACTATED RINGERS IV SOLN: INTRAVENOUS | Status: DC

## 2019-12-03 MED ORDER — ONDANSETRON HCL 4 MG/2ML IJ SOLN: 4.0000 mg | Freq: Four times a day (QID) | INTRAMUSCULAR | Status: DC | PRN

## 2019-12-03 MED ORDER — AMMONIA AROMATIC IN INHA
RESPIRATORY_TRACT | Status: AC
  Filled 2019-12-03: qty 10

## 2019-12-03 MED ORDER — BUTORPHANOL TARTRATE 1 MG/ML IJ SOLN
1.0000 mg | INTRAMUSCULAR | Status: DC | PRN
  Administered 2019-12-03: 1 mg via INTRAVENOUS
  Filled 2019-12-03: qty 1

## 2019-12-03 MED ORDER — LIDOCAINE HCL (PF) 1 % IJ SOLN
INTRAMUSCULAR | Status: AC
  Filled 2019-12-03: qty 30

## 2019-12-03 MED ORDER — WITCH HAZEL-GLYCERIN EX PADS
1.0000 "application " | MEDICATED_PAD | CUTANEOUS | Status: DC | PRN
  Administered 2019-12-03: 1 via TOPICAL
  Filled 2019-12-03: qty 100

## 2019-12-03 MED ORDER — OXYCODONE-ACETAMINOPHEN 5-325 MG PO TABS: 2.0000 | ORAL_TABLET | ORAL | Status: DC | PRN

## 2019-12-03 MED ORDER — METHYLERGONOVINE MALEATE 0.2 MG/ML IJ SOLN: 0.2000 mg | INTRAMUSCULAR | Status: DC | PRN

## 2019-12-03 MED ORDER — MISOPROSTOL 200 MCG PO TABS
ORAL_TABLET | ORAL | Status: AC
  Filled 2019-12-03: qty 4

## 2019-12-03 MED ORDER — SENNOSIDES-DOCUSATE SODIUM 8.6-50 MG PO TABS: 2.0000 | ORAL_TABLET | ORAL | Status: DC

## 2019-12-03 MED ORDER — FERROUS SULFATE 325 (65 FE) MG PO TABS
325.0000 mg | ORAL_TABLET | Freq: Every day | ORAL | Status: DC
  Administered 2019-12-04 – 2019-12-05 (×2): 325 mg via ORAL
  Filled 2019-12-03 (×2): qty 1

## 2019-12-03 MED ORDER — COCONUT OIL OIL
1.0000 "application " | TOPICAL_OIL | Status: DC | PRN
  Administered 2019-12-05: 1 via TOPICAL
  Filled 2019-12-03: qty 120

## 2019-12-03 MED ORDER — OXYTOCIN BOLUS FROM INFUSION
500.0000 mL | Freq: Once | INTRAVENOUS | Status: AC
  Administered 2019-12-03: 500 mL via INTRAVENOUS

## 2019-12-03 MED ORDER — OXYTOCIN 40 UNITS IN NORMAL SALINE INFUSION - SIMPLE MED
1.0000 m[IU]/min | INTRAVENOUS | Status: DC
  Filled 2019-12-03: qty 1000

## 2019-12-03 MED ORDER — LIDOCAINE HCL (PF) 1 % IJ SOLN
30.0000 mL | INTRAMUSCULAR | Status: AC | PRN
  Administered 2019-12-03: 30 mL via SUBCUTANEOUS

## 2019-12-03 MED ORDER — LACTATED RINGERS AMNIOINFUSION
INTRAVENOUS | Status: DC
  Administered 2019-12-03: 10:00:00 500 mL via INTRAUTERINE
  Filled 2019-12-03 (×3): qty 1000

## 2019-12-03 MED ORDER — LACTATED RINGERS IV SOLN
500.0000 mL | INTRAVENOUS | Status: DC | PRN
  Administered 2019-12-03: 09:00:00 1000 mL via INTRAVENOUS
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
## 2019-12-03 NOTE — OB Triage Note (Signed)
Pt. Presented to L/D from home with reported SROM 0635 today. She describes it as a dark/clear/greenish color. Color of fluid noted in urine sample. CTX began at 0130 and have increased in intensity and frequency. Pain is intermittent, rated 5/10. No bleeding. Pt. states positive fetal movement. VSS. Monitors applied and assessing.

## 2019-12-03 NOTE — Progress Notes (Signed)
LABOR NOTE   Tracy Patel 34 y.o.@ at [redacted]w[redacted]d  SUBJECTIVE: Uncomfortable with contractions.  Analgesia: Labor support without medications  OBJECTIVE:  BP 107/75 (BP Location: Left Arm)   Pulse 78   Temp 98.4 F (36.9 C) (Oral)   Resp 18   Ht 5\' 4"  (1.626 m)   Wt 99.8 kg   LMP 02/28/2019 (Exact Date)   BMI 37.76 kg/m  No intake/output data recorded.  She has not shown cervical change. IUPC placed, amnio infusion ordered for variable decelerations  SVE:   Dilation: 6.5 Effacement (%): 80 Station: -1 Exam by:: [email or web address] RN CONTRACTIONS: irregular, every 5-6  minutes FHR: Fetal heart tracing reviewed. Baseline: 140 bpm, Variability: Good {> 6 bpm), Accelerations: Non-reactive but appropriate for gestational age and Decelerations: variables with contractions Category II    Labs: Lab Results  Component Value Date   WBC 6.9 12/03/2019   HGB 12.2 12/03/2019   HCT 36.4 12/03/2019   MCV 88.6 12/03/2019   PLT 244 12/03/2019    ASSESSMENT: 1) Labor curve reviewed.       Progress: Active phase labor.     Membranes: ruptured, meconium moderate        2) IUPC placed, amnio infusion  Active Problems:   Labor and delivery, indication for care   PLAN: IV Pitocin augmentation, change maternal position, place IUPC and amnioinfusion   02/02/2020, CNM  12/03/2019 10:02 AM

## 2019-12-04 ENCOUNTER — Inpatient Hospital Stay: Payer: Medicaid Other

## 2019-12-04 LAB — CBC
HCT: 31.7 % — ABNORMAL LOW (ref 36.0–46.0)
Hemoglobin: 10.8 g/dL — ABNORMAL LOW (ref 12.0–15.0)
MCH: 29.9 pg (ref 26.0–34.0)
MCHC: 34.1 g/dL (ref 30.0–36.0)
MCV: 87.8 fL (ref 80.0–100.0)
Platelets: 206 10*3/uL (ref 150–400)
RBC: 3.61 MIL/uL — ABNORMAL LOW (ref 3.87–5.11)
RDW: 15.7 % — ABNORMAL HIGH (ref 11.5–15.5)
WBC: 10.3 10*3/uL (ref 4.0–10.5)
nRBC: 0 % (ref 0.0–0.2)

## 2019-12-04 LAB — RPR: RPR Ser Ql: NONREACTIVE

## 2019-12-04 NOTE — Lactation Note (Signed)
This note was copied from a baby's chart. Lactation Consultation Note  Patient Name: Tracy Patel [id number] Date: 12/04/2019 Reason for consult: Initial assessment;Difficult latch;Infant < 6lbs;Term;Other (Comment)(use of NS and SNS)  LC in to see mom and baby Tracy Patel. Mom is a G3P2 with a 2yr old at home. Mom attempted BF with first child noting "a lot of difficulties". LC from yesterday notes that mom has flat nipples, and baby is uninterested in feeding most times.  Mom was set-up with DEBP for breast stimulation/opportunity for supplement. Nipple shield with SNS (currently giving formula due to previous BS concerns). Most feeds have ended in finger feeds with SNS, baby not showing interest at the breast; falling asleep quickly.  Mom notes next feeding planned for around 10am, however baby has gone for circumcision; LC discussed how this may impact baby's feeding behaviors for the next few hours. Mom also will be heading for an ultrasound soon.   LC provided education for newborn feeding patterns, early feeding cues, changes in feeding around 24 hours: cluster feeding, voiding and stooling expectations, and breastfeeding support available.  Encouraged mom to call out for assistance/observation at the next feeding to help her with feeding plan.     Maternal Data Formula Feeding for Exclusion: No Has patient been taught Hand Expression?: Yes Does the patient have breastfeeding experience prior to this delivery?: Yes  Feeding    LATCH Score                   Interventions Interventions: Breast feeding basics reviewed  Lactation Tools Discussed/Used     Consult Status Consult Status: Follow-up Date: 12/04/19 Follow-up type: In-patient    Tracy Patel 12/04/2019, 10:34 AM

## 2019-12-04 NOTE — Lactation Note (Signed)
This note was copied from a baby's chart. Lactation Consultation Note  Patient Name: Tracy Patel [id number] Date: 12/04/2019   LC in to follow-up with mom. Mom lying with baby on chest swaddled and sleeping.  Mom reported to have fed approximately 1hr ago, set-up all pieces of SNS with formula, and nipple shield on her own, just as the 11am feeding. Mom states that this last feed baby had problems with accepting the nipple shield so she did a finger feeding instead with the SNS on her finger, baby took volume well.  LC asked about pumping, mom not pumping so far today. LC discussed milk supply and demand, although stimulation with SNS/NS is good with baby at breast, the importance of using DEBP when baby doesn't accept the breast well. Educated on the onset of milk production, colostrum through transitional milk.  Encouraged again for mom to call out to [facility] when baby feeds next for observation, and assistance to ensure good practices before discharge tomorrow. Mom nodded in agreement.  Maternal Data    Feeding Feeding Type: Formula  LATCH Score                   Interventions    Lactation Tools Discussed/Used Tools: Supplemental Nutrition System;34F feeding tube / Syringe   Consult Status      Danford Bad 12/04/2019, 2:46 PM

## 2019-12-04 NOTE — Progress Notes (Signed)
Progress Note - Vaginal Delivery  Tracy Patel is a 34 y.o. [id number] now PP day 1 s/p Vaginal, Spontaneous . Having some difficulty with nursing. Working with lactation and Education administrator.   Subjective:  The patient reports up ad lib, voiding, tolerating PO, + flatus and tenderness on the back of her right calf   Objective:  Vital signs in last 24 hours: Temp:  [98 F (36.7 C)-98.8 F (37.1 C)] 98.3 F (36.8 C) (05/10 2323) Pulse Rate:  [60-82] 74 (05/10 2323) Resp:  [18-20] 18 (05/10 2323) BP: (90-124)/(52-71) 95/52 (05/10 2323) SpO2:  [98 %-100 %] 99 % (05/10 2323)  Physical Exam:  General: alert, cooperative, appears stated age and no distress Lochia: appropriate Uterine Fundus: firm @ u Negative holman's sign bilaterally, no significant swelling or redness noted on back of calf. Warm to touch bilaterally. Will continue to monitor today.     Data Review Recent Labs    12/03/19 0833 12/04/19 0426  HGB 12.2 10.8*  HCT 36.4 31.7*    Assessment/Plan: Active Problems:   Labor and delivery, indication for care   Plan for discharge tomorrow   -- Continue routine PP care.     Doreene Burke, CNM  12/04/2019 8:09 AM

## 2019-12-04 NOTE — Progress Notes (Signed)
RN called and stated that she got a positive Homens sign on exam and pt continues to complain of discomfort. U/s ordered.   Doreene Burke, CNM

## 2019-12-05 MED ORDER — IBUPROFEN 600 MG PO TABS: 600.0000 mg | ORAL_TABLET | Freq: Four times a day (QID) | ORAL | 0 refills | Status: DC

## 2019-12-05 MED ORDER — COCONUT OIL OIL: 1.0000 "application " | TOPICAL_OIL | 0 refills | Status: DC | PRN

## 2019-12-05 MED ORDER — FERROUS SULFATE 325 (65 FE) MG PO TABS: 325.0000 mg | ORAL_TABLET | Freq: Every day | ORAL | 3 refills | Status: DC

## 2019-12-05 NOTE — Discharge Summary (Signed)
Obstetric Discharge Summary  Patient ID: Tracy Patel MRN: [id number] DOB/AGE: 35-Jan-1987 35 y.o.   Date of Admission: 12/03/2019  Date of Discharge:  12/05/19  Admitting Diagnosis: Onset of Labor at [redacted]w[redacted]d  Secondary Diagnosis: History migraines, Rh positive, GBS negative  Mode of Delivery: normal spontaneous vaginal delivery     Discharge Diagnosis: No other diagnosis   Intrapartum Procedures: pitocin augmentation, placement of fetal scalp electrode and placement of intrauterine catheter   Post partum procedures: None  Complications: First degree perineal laceration, repaired   Brief Hospital Course   Tracy Patel is a K2H0623 who had a SVD on 12/03/2019;  for further details of this birth, please refer to the delivey summary.  Patient had an uncomplicated postpartum course except for positive Homan sign with negative DVT ultrasound on PPD#1.  By time of discharge on PPD#2, her pain was controlled on oral pain medications; she had appropriate lochia and was ambulating, voiding without difficulty and tolerating regular diet.  She was deemed stable for discharge to home.    Labs: CBC Latest Ref Rng & Units 12/04/2019 12/03/2019 09/14/2019  WBC 4.0 - 10.5 K/uL 10.3 6.9 8.3  Hemoglobin 12.0 - 15.0 g/dL 10.8(L) 12.2 12.2  Hematocrit 36.0 - 46.0 % 31.7(L) 36.4 35.3  Platelets 150 - 400 K/uL 206 244 279   B POS Performed at [facility], [address] [place], [place] [address]   Physical exam:   Temp:  [98.1 F (36.7 C)-98.7 F (37.1 C)] 98.5 F (36.9 C) (05/12 0757) Pulse Rate:  [60-63] 62 (05/12 0757) Resp:  [18-20] 20 (05/12 0757) BP: (114-121)/(73-89) 120/77 (05/12 0757) SpO2:  [99 %-100 %] 99 % (05/12 0757)  General: alert and no distress  Lochia: appropriate  Abdomen: soft, NT  Uterine Fundus: firm  Perineum: healing well, no significant drainage, no dehiscence, no significant erythema  Extremities: No evidence of DVT seen on  physical exam. No lower extremity edema.  Edinburgh Postnatal Depression Scale Screening Tool 12/03/2019  I have been able to laugh and see the funny side of things. 0  I have looked forward with enjoyment to things. 0  I have blamed myself unnecessarily when things went wrong. 1  I have been anxious or worried for no good reason. 1  I have felt scared or panicky for no good reason. 1  Things have been getting on top of me. 1  I have been so unhappy that I have had difficulty sleeping. 1  I have felt sad or miserable. 0  I have been so unhappy that I have been crying. 0  The thought of harming myself has occurred to me. 0  Edinburgh Postnatal Depression Scale Total 5    Discharge Instructions: Per After Visit Summary.  Activity: Advance as tolerated. Pelvic rest for 6 weeks.  Also refer to After Visit Summary.  Diet: Regular  Medications: Allergies as of 12/05/2019      Reactions   Gluten Meal    Hives and throat tightens and get scratchy.      Medication List    TAKE these medications   albuterol 108 (90 Base) MCG/ACT inhaler Commonly known as: VENTOLIN HFA Inhale 2 puffs into the lungs every 4 (four) hours as needed. For shortness of breath.   Claritin 10 MG Caps Generic drug: Loratadine Take 1 capsule by mouth daily.   coconut oil Oil Apply 1 application topically as needed.   ferrous sulfate 325 (65 FE) MG tablet Take 1 tablet (325  mg total) by mouth daily with breakfast. Start taking on: Dec 06, 2019   Flonase 50 MCG/ACT nasal spray Generic drug: fluticasone Place 1 spray into both nostrils as needed.   ibuprofen 600 MG tablet Commonly known as: ADVIL Take 1 tablet (600 mg total) by mouth every 6 (six) hours.   prenatal multivitamin Tabs tablet Take 1 tablet by mouth daily at 12 noon.      Outpatient follow up:  Follow-up Information    [name], CNM Follow up.   Specialties: Certified Nurse Midwife, Radiology Why: Someone from the office will  call you to schedule your two (2) week televisit and six (6) week postpartum visit.  Contact information: [place] [address] [phone]        [facility] Follow up.   Why: Please come in the office in one (1) week for PPV to check calf pain Contact information: [place].  [place] [phone]         Postpartum contraception: Education provided see AVS, will discuss further at postpartum visit  Discharged Condition: stable  Discharged to: home   Newborn Data:  Disposition:home with mother  Apgars: APGAR (1 MIN): 8   APGAR (5 MINS): 9    Baby Feeding: Breast with Formula supplementation via SNS   Tracy Patel, CNM [facility], [facility] 12/05/19 10:55 AM

## 2019-12-05 NOTE — Progress Notes (Signed)
Tracy Patel CNM notified that Pt has c/o Right calf pain with flexion of Right Foot. Positive Pedal Pulses equal and strong with cap. Refill < 2 seconds. Non Pitting edema of ankles; although right ankle has sl more swelling then left. M. Lawhorn stated Pt had Ultrasound that ruled out DVT on 12/04/19 and that when she assessed Pt. This morning, Pt. Stated pain was 'Better."

## 2019-12-05 NOTE — Lactation Note (Signed)
This note was copied from a baby's chart. Lactation Consultation Note  Patient Name: Tracy Patel [name] Date: 12/05/2019 Reason for consult: Follow-up assessment;Difficult latch;Term;Other (Comment)(SNS)  LC to check in on mom and baby before discharge. Mom up, eating breakfast, with baby sleeping. Last feeding after 6am, with SNS and formula.  Mom is starting to feel uterine cramping with baby at the breast, reports improved acceptance of nipple shield, and continuing to feel confident in use of SNS and getting baby to breast.  LC reviewed signs of breast stimulation, hormones, normal course of lactation, and benefits of continuing SNS, and idea of use of personal EBP at home for additional stimulation. Reviewed early hunger cues, newborn stomach size and growth, and formula preparation at home, and cleaning of all supplies being used. Mom states all her questions are answered at this time. LC number changed on whiteboard; wrong number initially given, mom plans to call out for observe feeding before leaving today. Information for outpatient lactation services given and community breastfeeding support groups.  Maternal Data Has patient been taught Hand Expression?: Yes Does the patient have breastfeeding experience prior to this delivery?: Yes  Feeding    LATCH Score                   Interventions Interventions: Breast feeding basics reviewed;DEBP  Lactation Tools Discussed/Used     Consult Status Consult Status: Complete Date: 12/05/19 Follow-up type: Call as needed    Tracy Patel 12/05/2019, 9:12 AM

## 2019-12-05 NOTE — Progress Notes (Addendum)
Tracy Patel just called Unit and stated she verified that Dr Valentino Saxon was OK with Pt D/C despite Pt. C/O right calf pain and that Dr. Valentino Saxon verified that M. Lawhorn CNM could D/C Pt. Pt. To have TED Hose placed prior to D/C. Lawhorn CNM also stated the office will call Pt. And make an appointment for recheck of leg next week.

## 2019-12-05 NOTE — Progress Notes (Signed)
DC to home to car via auxillary.  Pt verb u/o and knows of f/u appointment.

## 2019-12-05 NOTE — Progress Notes (Signed)
Patient given D/C Instructions and instructed that OBGYN office will call her with F//U appointment in 1 week to assess right leg unless she has increasing pain prior to that appointment. TED Hose Placed as per order. Pt v/o of all the above.

## 2019-12-05 NOTE — Discharge Instructions (Signed)
Postpartum Care After Vaginal Delivery This sheet gives you information about how to care for yourself from the time you deliver your baby to up to 6-12 weeks after delivery (postpartum period). Your health care provider may also give you more specific instructions. If you have problems or questions, contact your health care provider. Follow these instructions at home: Vaginal bleeding  It is normal to have vaginal bleeding (lochia) after delivery. Wear a sanitary pad for vaginal bleeding and discharge. ? During the first week after delivery, the amount and appearance of lochia is often similar to a menstrual period. ? Over the next few weeks, it will gradually decrease to a dry, yellow-brown discharge. ? For most women, lochia stops completely by 4-6 weeks after delivery. Vaginal bleeding can vary from woman to woman.  Change your sanitary pads frequently. Watch for any changes in your flow, such as: ? A sudden increase in volume. ? A change in color. ? Large blood clots.  If you pass a blood clot from your vagina, save it and call your health care provider to discuss. Do not flush blood clots down the toilet before talking with your health care provider.  Do not use tampons or douches until your health care provider says this is safe.  If you are not breastfeeding, your period should return 6-8 weeks after delivery. If you are feeding your child breast milk only (exclusive breastfeeding), your period may not return until you stop breastfeeding. Perineal care  Keep the area between the vagina and the anus (perineum) clean and dry as told by your health care provider. Use medicated pads and pain-relieving sprays and creams as directed.  If you had a cut in the perineum (episiotomy) or a tear in the vagina, check the area for signs of infection until you are healed. Check for: ? More redness, swelling, or pain. ? Fluid or blood coming from the cut or tear. ? Warmth. ? Pus or a bad  smell.  You may be given a squirt bottle to use instead of wiping to clean the perineum area after you go to the bathroom. As you start healing, you may use the squirt bottle before wiping yourself. Make sure to wipe gently.  To relieve pain caused by an episiotomy, a tear in the vagina, or swollen veins in the anus (hemorrhoids), try taking a warm sitz bath 2-3 times a day. A sitz bath is a warm water bath that is taken while you are sitting down. The water should only come up to your hips and should cover your buttocks. Breast care  Within the first few days after delivery, your breasts may feel heavy, full, and uncomfortable (breast engorgement). Milk may also leak from your breasts. Your health care provider can suggest ways to help relieve the discomfort. Breast engorgement should go away within a few days.  If you are breastfeeding: ? Wear a bra that supports your breasts and fits you well. ? Keep your nipples clean and dry. Apply creams and ointments as told by your health care provider. ? You may need to use breast pads to absorb milk that leaks from your breasts. ? You may have uterine contractions every time you breastfeed for up to several weeks after delivery. Uterine contractions help your uterus return to its normal size. ? If you have any problems with breastfeeding, work with your health care provider or lactation consultant.  If you are not breastfeeding: ? Avoid touching your breasts a lot. Doing this can make   your breasts produce more milk. ? Wear a good-fitting bra and use cold packs to help with swelling. ? Do not squeeze out (express) milk. This causes you to make more milk. Intimacy and sexuality  Ask your health care provider when you can engage in sexual activity. This may depend on: ? Your risk of infection. ? How fast you are healing. ? Your comfort and desire to engage in sexual activity.  You are able to get pregnant after delivery, even if you have not had  your period. If desired, talk with your health care provider about methods of birth control (contraception). Medicines  Take over-the-counter and prescription medicines only as told by your health care provider.  If you were prescribed an antibiotic medicine, take it as told by your health care provider. Do not stop taking the antibiotic even if you start to feel better. Activity  Gradually return to your normal activities as told by your health care provider. Ask your health care provider what activities are safe for you.  Rest as much as possible. Try to rest or take a nap while your baby is sleeping. Eating and drinking   Drink enough fluid to keep your urine pale yellow.  Eat high-fiber foods every day. These may help prevent or relieve constipation. High-fiber foods include: ? Whole grain cereals and breads. ? Brown rice. ? Beans. ? Fresh fruits and vegetables.  Do not try to lose weight quickly by cutting back on calories.  Take your prenatal vitamins until your postpartum checkup or until your health care provider tells you it is okay to stop. Lifestyle  Do not use any products that contain nicotine or tobacco, such as cigarettes and e-cigarettes. If you need help quitting, ask your health care provider.  Do not drink alcohol, especially if you are breastfeeding. General instructions  Keep all follow-up visits for you and your baby as told by your health care provider. Most women visit their health care provider for a postpartum checkup within the first 3-6 weeks after delivery. Contact a health care provider if:  You feel unable to cope with the changes that your child brings to your life, and these feelings do not go away.  You feel unusually sad or worried.  Your breasts become red, painful, or hard.  You have a fever.  You have trouble holding urine or keeping urine from leaking.  You have little or no interest in activities you used to enjoy.  You have not  breastfed at all and you have not had a menstrual period for 12 weeks after delivery.  You have stopped breastfeeding and you have not had a menstrual period for 12 weeks after you stopped breastfeeding.  You have questions about caring for yourself or your baby.  You pass a blood clot from your vagina. Get help right away if:  You have chest pain.  You have difficulty breathing.  You have sudden, severe leg pain.  You have severe pain or cramping in your lower abdomen.  You bleed from your vagina so much that you fill more than one sanitary pad in one hour. Bleeding should not be heavier than your heaviest period.  You develop a severe headache.  You faint.  You have blurred vision or spots in your vision.  You have bad-smelling vaginal discharge.  You have thoughts about hurting yourself or your baby. If you ever feel like you may hurt yourself or others, or have thoughts about taking your own life, get help  right away. You can go to the nearest emergency department or call:  Your local emergency services (911 in the [place]  A suicide crisis helpline, such as the [place] at 773-567-8467. This is open 24 hours a day. Summary  The period of time right after you deliver your newborn up to 6-12 weeks after delivery is called the postpartum period.  Gradually return to your normal activities as told by your health care provider.  Keep all follow-up visits for you and your baby as told by your health care provider. This information is not intended to replace advice given to you by your health care provider. Make sure you discuss any questions you have with your health care provider. Document Revised: 07/15/2017 Document Reviewed: 04/25/2017 Elsevier Patient Education  2020 [employer].   Postpartum Baby Blues The postpartum period begins right after the birth of a baby. During this time, there is often a lot of joy and excitement. It is also  a time of many changes in the life of the parents. No matter how many times a mother gives birth, each child brings new challenges to the family, including different ways of relating to one another. It is common to have feelings of excitement along with confusing changes in moods, emotions, and thoughts. You may feel happy one minute and sad or stressed the next. These feelings of sadness usually happen in the period right after you have your baby, and they go away within a week or two. This is called the "baby blues." What are the causes? There is no known cause of baby blues. It is likely caused by a combination of factors. However, changes in hormone levels after childbirth are believed to trigger some of the symptoms. Other factors that can play a role in these mood changes include:  Lack of sleep.  Stressful life events, such as poverty, caring for a loved one, or death of a loved one.  Genetics. What are the signs or symptoms? Symptoms of this condition include:  Brief changes in mood, such as going from extreme happiness to sadness.  Decreased concentration.  Difficulty sleeping.  Crying spells and tearfulness.  Loss of appetite.  Irritability.  Anxiety. If the symptoms of baby blues last for more than 2 weeks or become more severe, you may have postpartum depression. How is this diagnosed? This condition is diagnosed based on an evaluation of your symptoms. There are no medical or lab tests that lead to a diagnosis, but there are various questionnaires that a health care provider may use to identify women with the baby blues or postpartum depression. How is this treated? Treatment is not needed for this condition. The baby blues usually go away on their own in 1-2 weeks. Social support is often all that is needed. You will be encouraged to get adequate sleep and rest. Follow these instructions at home: Lifestyle      Get as much rest as you can. Take a nap when the baby  sleeps.  Exercise regularly as told by your health care provider. Some women find yoga and walking to be helpful.  Eat a balanced and nourishing diet. This includes plenty of fruits and vegetables, whole grains, and lean proteins.  Do little things that you enjoy. Have a cup of tea, take a bubble bath, read your favorite magazine, or listen to your favorite music.  Avoid alcohol.  Ask for help with household chores, cooking, grocery shopping, or running errands. Do not try  to do everything yourself. Consider hiring a postpartum doula to help. This is a professional who specializes in providing support to new mothers.  Try not to make any major life changes during pregnancy or right after giving birth. This can add stress. General instructions  Talk to people close to you about how you are feeling. Get support from your partner, family members, friends, or other new moms. You may want to join a support group.  Find ways to cope with stress. This may include: ? Writing your thoughts and feelings in a journal. ? Spending time outside. ? Spending time with people who make you laugh.  Try to stay positive in how you think. Think about the things you are grateful for.  Take over-the-counter and prescription medicines only as told by your health care provider.  Let your health care provider know if you have any concerns.  Keep all postpartum visits as told by your health care provider. This is important. Contact a health care provider if:  Your baby blues do not go away after 2 weeks. Get help right away if:  You have thoughts of taking your own life (suicidal thoughts).  You think you may harm the baby or other people.  You see or hear things that are not there (hallucinations). Summary  After giving birth, you may feel happy one minute and sad or stressed the next. Feelings of sadness that happen right after the baby is born and go away after a week or two are called the "baby  blues."  You can manage the baby blues by getting enough rest, eating a healthy diet, exercising, spending time with supportive people, and finding ways to cope with stress.  If feelings of sadness and stress last longer than 2 weeks or get in the way of caring for your baby, talk to your health care provider. This may mean you have postpartum depression. This information is not intended to replace advice given to you by your health care provider. Make sure you discuss any questions you have with your health care provider. Document Revised: 11/03/2018 Document Reviewed: 09/07/2016 Elsevier Patient Education  2020 [employer]   Perinatal Depression When a woman feels excessive sadness, anger, or anxiety during pregnancy or during the first 12 months after she gives birth, she has a condition called perinatal depression. Depression can interfere with work, school, relationships, and other everyday activities. If it is not managed properly, it can also cause problems in the mother and her baby. Sometimes, perinatal depression is left untreated because symptoms are thought to be normal mood swings during and right after pregnancy. If you have symptoms of depression, it is important to talk with your health care provider. What are the causes? The exact cause of this condition is not known. Hormonal changes during and after pregnancy may play a role in causing perinatal depression. What increases the risk? You are more likely to develop this condition if:  You have a personal or family history of depression, anxiety, or mood disorders.  You experience a stressful life event during pregnancy, such as the death of a loved one.  You have a lot of regular life stress.  You do not have support from family members or loved ones, or you are in an abusive relationship. What are the signs or symptoms? Symptoms of this condition include:  Feeling sad or hopeless.  Feelings of guilt.  Feeling  irritable or overwhelmed.  Changes in your appetite.  Lack of energy or  motivation.  Sleep problems.  Difficulty concentrating or completing tasks.  Loss of interest in hobbies or relationships.  Headaches or stomach problems that do not go away. How is this diagnosed? This condition is diagnosed based on a physical exam and mental evaluation. In some cases, your health care provider may use a depression screening tool. These tools include a list of questions that can help a health care provider diagnose depression. Your health care provider may refer you to a mental health expert who specializes in depression. How is this treated? This condition may be treated with:  Medicines. Your health care provider will only give you medicines that have been proven safe for pregnancy and breastfeeding.  Talk therapy with a mental health professional to help change your patterns of thinking (cognitive behavioral therapy).  Support groups.  Brain stimulation or light therapies.  Stress reduction therapies, such as mindfulness. Follow these instructions at home: Lifestyle  Do not use any products that contain nicotine or tobacco, such as cigarettes and e-cigarettes. If you need help quitting, ask your health care provider.  Do not use alcohol when you are pregnant. After your baby is born, limit alcohol intake to no more than 1 drink a day. One drink equals 12 oz of beer, 5 oz of wine, or 1 oz of hard liquor.  Consider joining a support group for new mothers. Ask your health care provider for recommendations.  Take good care of yourself. Make sure you: ? Get plenty of sleep. If you are having trouble sleeping, talk with your health care provider. ? Eat a healthy diet. This includes plenty of fruits and vegetables, whole grains, and lean proteins. ? Exercise regularly, as told by your health care provider. Ask your health care provider what exercises are safe for you. General  instructions  Take over-the-counter and prescription medicines only as told by your health care provider.  Talk with your partner or family members about your feelings during pregnancy. Share any concerns or anxieties that you may have.  Ask for help with tasks or chores when you need it. Ask friends and family members to provide meals, watch your children, or help with cleaning.  Keep all follow-up visits as told by your health care provider. This is important. Contact a health care provider if:  You (or people close to you) notice that you have any symptoms of depression.  You have depression and your symptoms get worse.  You experience side effects from medicines, such as nausea or sleep problems. Get help right away if:  You feel like hurting yourself, your baby, or someone else. If you ever feel like you may hurt yourself or others, or have thoughts about taking your own life, get help right away. You can go to your nearest emergency department or call:  Your local emergency services (911 in the [place]  A suicide crisis helpline, such as the National Suicide Prevention Lifeline at 617-881-5407. This is open 24 hours a day. Summary  Perinatal depression is when a woman feels excessive sadness, anger, or anxiety during pregnancy or during the first 12 months after she gives birth.  If perinatal depression is not treated, it can lead to health problems for the mother and her baby.  This condition is treated with medicines, talk therapy, stress reduction therapies, or a combination of two or more treatments.  Talk with your partner or family members about your feelings. Do not be afraid to ask for help. This information is not intended to  replace advice given to you by your health care provider. Make sure you discuss any questions you have with your health care provider. Document Revised: 12/27/2018 Document Reviewed: 09/08/2016 Elsevier Patient Education  2020 [employer]  Inc[employer]   Breast Pumping Tips There may be times when you cannot feed your baby from your breast, such as when you are at work or on a trip. Breast pumping allows you to remove milk from your breast in order to store for later use. There are three ways to pump. You can use:  Your hand to massage and squeeze your breast (hand expression).  A handheld manual pump.  An electric pump. When you first start to pump, you may not get much milk, but after a few days your breasts should start to make more. Pumping can help stimulate your milk supply after your baby is born. It can also help maintain your milk supply when you are away from your baby. When should I pump? You can start pumping soon after your baby is born. Here are some tips on when to pump:  When with your baby: ? Pump after breastfeeding. ? Pump from the free breast while you breastfeed.  When away from your baby: ? Pump every 2-3 hours for about 15 minutes. ? Pump both breasts at the same time if you can.  If your baby gets formula feeding, pump around the time your baby gets that feeding.  If you drank alcohol, wait 2 hours before pumping.  If you are having a procedure with anesthesia, talk to your health care provider about when you should pump before and after. How do I prepare to pump? Take steps to relax. This makes it easier to stimulate your let-down reflex, which is what makes breast milk flow. To help:  Smell one of your infant's blankets or an item of clothing.  Look at a picture or video of your infant.  Sit in a quiet, private space.  Massage your breast and nipple.  Place a warm cloth on your breast. The cloth should be a little wet.  Play relaxing music.  Picture your milk flowing. What are some tips? General tips for pumping breast milk   Always wash your hands before pumping.  If you are not getting very much milk or pumping is uncomfortable, make adjustments to your pump or try using different  type of pumps.  Drink enough fluid to keep your urine clear or pale yellow.  Wear clothing that opens in the front or allows easy access to your breasts.  Pump breast milk directly into clean bottles or other storage containers.  Do not use any products that contain nicotine or tobacco, such as cigarettes and e-cigarettes. These can lower your milk supply and harm your infant. If you need help quitting, ask your health care provider. Tips for storing breast milk   Store breast milk in a clean, BPA-free container, such as glass or plastic bottles or milk storage bags.  Store breast milk in 2-4 ounce batches to reduce waste.  Swirl the breast milk in the container to mix any cream that floats to the top. Do not shake it.  Label all stored milk with the date you pumped it.  The amount of time you can keep breast milk depends on where it is stored: ? Room temperature: 6-8 hours, if the milk is clean. It is best if used within 4 hours. ? Cooler with ice packs: 24 hours. ? Refrigerator: 5-8 days, if the milk  is clean. It is best if used within 3 days. ? Freezer: 9-12 months, if the milk is clean and stored away from the freezer door. It is best if used within 6 months.  When using a refrigerator or freezer, put the milk in the back to keep it as cold as possible.  Thaw frozen milk using warm water. Do not use the microwave. Tips for choosing a breast pump The right pump for you will depend on your comfort and how often you will be away from your baby. When choosing a pump, consider the following:  Manual breast pumps do not need electricity to work. They are usually cheaper than electric pumps, but they can be harder to use. They may be a good choice if you are occasionally away from your baby.  Electric breast pumps are usually more expensive than manual pumps, but they can be easier for some women to use. They can also collect more milk than manual pumps. This makes them a good choice  for women who work in an office or need to be away from their baby for longer periods of time.  The suction cup (flange) should be the right size. If it is the wrong size, it may cause pain and nipple damage.  Before buying a pump, find out whether your insurance covers the cost of a breast pump. Tips for maintaining a breast pump  Check your pump's manual for cleaning tips.  Clean the pump after each use. To do this: ? Wipe down the electrical unit. Use a dry, soft cloth or clean paper towel. Do not put the electrical unit in water or cleaning products. ? Wash the plastic pump parts with soap and warm water or in the dishwasher, if the parts are dishwasher safe. You do not need to clean the tubing unless it comes in contact with breast milk. Let the parts air dry. Avoid drying them with a cloth or towel. ? When the pump parts are clean and dry, put the pump back together. Then store the pump.  If there is water in the tubing when it comes time to pump, attach the tubing to the pump and turn on the pump. Run the pump until the tube is dry.  Avoid touching the inside of pump parts that come in contact with breast milk. Summary  Pumping can help stimulate your milk supply after your baby is born. It can also help maintain your milk supply when you are away from your baby.  When you are away from your infant for several hours, pump for about 15 minutes every 2-3 hours. Pump both breasts at the same time, if you can.  Your health care provider or lactation consultant can help you decide which breast pump is right for you. The right pump for you depends on your comfort, work schedule, and how often you may be away from your baby. This information is not intended to replace advice given to you by your health care provider. Make sure you discuss any questions you have with your health care provider. Document Revised: 11/01/2018 Document Reviewed: 08/16/2016 Elsevier Patient Education  [place].   Intrauterine Device Information An intrauterine device (IUD) is a medical device that is inserted in the uterus to prevent pregnancy. It is a small, T-shaped device that has one or two nylon strings hanging down from it. The strings hang out of the lower part of the uterus (cervix) to allow for future IUD removal. There are  two types of IUDs available:  Hormone IUD. This type of IUD is made of plastic and contains the hormone progestin (synthetic progesterone). A hormone IUD may last 3-5 years.  Copper IUD. This type of IUD has copper wire wrapped around it. A copper IUD may last up to 10 years. How is an IUD inserted? An IUD is inserted through the vagina and placed into the uterus with a minor medical procedure. The exact procedure for IUD insertion may vary among health care providers and hospitals. How does an IUD work? Synthetic progesterone in a hormonal IUD prevents pregnancy by:  Thickening cervical mucus to prevent sperm from entering the uterus.  Thinning the uterine lining to prevent a fertilized egg from being implanted there. Copper in a copper IUD prevents pregnancy by making the uterus and fallopian tubes produce a fluid that kills sperm. What are the advantages of an IUD? Advantages of either type of IUD  It is highly effective in preventing pregnancy.  It is reversible. You can become pregnant shortly after the IUD is removed.  It is low-maintenance and can stay in place for a long time.  There are no estrogen-related side effects.  It can be used when breastfeeding.  It is not associated with weight gain.  It can be inserted right after childbirth, an abortion, or a miscarriage. Advantages of a hormone IUD  If it is inserted within 7 days of your period starting, it works right after it is inserted. If the hormone IUD is inserted at any other time in your cycle, you will need to use a backup method of birth control for 7 days after  insertion.  It can make menstrual periods lighter.  It can reduce menstrual cramping.  It can be used for 3-5 years. Advantages of a copper IUD  It works right after it is inserted.  It can be used as a form of emergency birth control if it is inserted within 5 days after having unprotected sex.  It does not interfere with your body's natural hormones.  It can be used for 10 years. What are the disadvantages of an IUD?  An IUD may cause irregular menstrual bleeding for a period of time after insertion.  You may have pain during insertion and have cramping and vaginal bleeding after insertion.  An IUD may cut the uterus (uterine perforation) when it is inserted. This is rare.  An IUD may cause pelvic inflammatory disease (PID), which is an infection in the uterus and fallopian tubes. This is rare, and it usually happens during the first 20 days after the IUD is inserted.  A copper IUD can make your menstrual flow heavier and more painful. How is an IUD removed?  You will lie on your back with your knees bent and your feet in footrests (stirrups).  A device will be inserted into your vagina to spread apart the vaginal walls (speculum). This will allow your health care provider to see the strings attached to the IUD.  Your health care provider will use a small instrument (forceps) to grasp the IUD strings and pull firmly until the IUD is removed. You may have some discomfort when the IUD is removed. Your health care provider may recommend taking over-the-counter pain relievers, such as ibuprofen, before the procedure. You may also have minor spotting for a few days after the procedure. The exact procedure for IUD removal may vary among health care providers and hospitals. Is the IUD right for me? Your health care  provider will make sure you are a good candidate for an IUD and will discuss the advantages, disadvantages, and possible side effects with you. Summary  An intrauterine  device (IUD) is a medical device that is inserted in the uterus to prevent pregnancy. It is a small, T-shaped device that has one or two nylon strings hanging down from it.  A hormone IUD contains the hormone progestin (synthetic progesterone). A copper IUD has copper wire wrapped around it.  Synthetic progesterone in a hormone IUD prevents pregnancy by thickening cervical mucus and thinning the walls of the uterus. Copper in a copper IUD prevents pregnancy by making the uterus and fallopian tubes produce a fluid that kills sperm.  A hormone IUD can be left in place for 3-5 years. A copper IUD can be left in place for up to 10 years.  An IUD is inserted and removed by a health care provider. You may feel some pain during insertion and removal. Your health care provider may recommend taking over-the-counter pain medicine, such as ibuprofen, before an IUD procedure. This information is not intended to replace advice given to you by your health care provider. Make sure you discuss any questions you have with your health care provider. Document Revised: 06/24/2017 Document Reviewed: 08/10/2016 Elsevier Patient Education  2020 [employer].  Levonorgestrel intrauterine device (IUD) What is this medicine? LEVONORGESTREL IUD (LEE voe nor jes trel) is a contraceptive (birth control) device. The device is placed inside the uterus by a healthcare professional. It is used to prevent pregnancy. This device can also be used to treat heavy bleeding that occurs during your period. This medicine may be used for other purposes; ask your health care provider or pharmacist if you have questions. COMMON BRAND NAME(S): Jassmine Ali What should I tell my health care provider before I take this medicine? They need to know if you have any of these conditions:  abnormal Pap smear  cancer of the breast, uterus, or cervix  diabetes  endometritis  genital or pelvic infection now or in the  past  have more than one sexual partner or your partner has more than one partner  heart disease  history of an ectopic or tubal pregnancy  immune system problems  IUD in place  liver disease or tumor  problems with blood clots or take blood-thinners  seizures  use intravenous drugs  uterus of unusual shape  vaginal bleeding that has not been explained  an unusual or allergic reaction to levonorgestrel, other hormones, silicone, or polyethylene, medicines, foods, dyes, or preservatives  pregnant or trying to get pregnant  breast-feeding How should I use this medicine? This device is placed inside the uterus by a health care professional. Talk to your pediatrician regarding the use of this medicine in children. Special care may be needed. Overdosage: If you think you have taken too much of this medicine contact a poison control center or emergency room at once. NOTE: This medicine is only for you. Do not share this medicine with others. What if I miss a dose? This does not apply. Depending on the brand of device you have inserted, the device will need to be replaced every 3 to 6 years if you wish to continue using this type of birth control. What may interact with this medicine? Do not take this medicine with any of the following medications:  amprenavir  bosentan  fosamprenavir This medicine may also interact with the following medications:  aprepitant  armodafinil  barbiturate  medicines for inducing sleep or treating seizures  bexarotene  boceprevir  griseofulvin  medicines to treat seizures like carbamazepine, ethotoin, felbamate, oxcarbazepine, phenytoin, topiramate  modafinil  pioglitazone  rifabutin  rifampin  rifapentine  some medicines to treat HIV infection like atazanavir, efavirenz, indinavir, lopinavir, nelfinavir, tipranavir, ritonavir  St. John's wort  warfarin This list may not describe all possible interactions. Give your  health care provider a list of all the medicines, herbs, non-prescription drugs, or dietary supplements you use. Also tell them if you smoke, drink alcohol, or use illegal drugs. Some items may interact with your medicine. What should I watch for while using this medicine? Visit your doctor or health care professional for regular check ups. See your doctor if you or your partner has sexual contact with others, becomes HIV positive, or gets a sexual transmitted disease. This product does not protect you against HIV infection (AIDS) or other sexually transmitted diseases. You can check the placement of the IUD yourself by reaching up to the top of your vagina with clean fingers to feel the threads. Do not pull on the threads. It is a good habit to check placement after each menstrual period. Call your doctor right away if you feel more of the IUD than just the threads or if you cannot feel the threads at all. The IUD may come out by itself. You may become pregnant if the device comes out. If you notice that the IUD has come out use a backup birth control method like condoms and call your health care provider. Using tampons will not change the position of the IUD and are okay to use during your period. This IUD can be safely scanned with magnetic resonance imaging (MRI) only under specific conditions. Before you have an MRI, tell your healthcare provider that you have an IUD in place, and which type of IUD you have in place. What side effects may I notice from receiving this medicine? Side effects that you should report to your doctor or health care professional as soon as possible:  allergic reactions like skin rash, itching or hives, swelling of the face, lips, or tongue  fever, flu-like symptoms  genital sores  high blood pressure  no menstrual period for 6 weeks during use  pain, swelling, warmth in the leg  pelvic pain or tenderness  severe or sudden headache  signs of  pregnancy  stomach cramping  sudden shortness of breath  trouble with balance, talking, or walking  unusual vaginal bleeding, discharge  yellowing of the eyes or skin Side effects that usually do not require medical attention (report to your doctor or health care professional if they continue or are bothersome):  acne  breast pain  change in sex drive or performance  changes in weight  cramping, dizziness, or faintness while the device is being inserted  headache  irregular menstrual bleeding within first 3 to 6 months of use  nausea This list may not describe all possible side effects. Call your doctor for medical advice about side effects. You may report side effects to FDA at 1-800-FDA-1088. Where should I keep my medicine? This does not apply. NOTE: This sheet is a summary. It may not cover all possible information. If you have questions about this medicine, talk to your doctor, pharmacist, or health care provider.  2020 Elsevier/Gold Standard (2018-05-23 13:22:01)   Contraceptive Barrier Methods A barrier method is a type of birth control (contraception) that is used to prevent pregnancy. Barrier methods include:  Female condom.  Female condom.  Diaphragm.  Cervical cap.  Sponge.  Spermicide. Your health care provider can help you decide what form of contraception is best for you. Always keep in mind that the risk of an STI (sexually transmitted infection) exists even when a contraceptive barrier method is used. Female condom  A female condom is a thin sheath that is worn over the penis during sex. Condoms prevent pregnancy by catching and stopping sperm from reaching the uterus. They also help to protect against STIs. Some condoms come with a sperm-killing substance (spermicide) on them. Female condoms are made of latex, rubber, or a type of plastic called polyurethane. Condoms that are made of latex and polyurethane provide the best protection against many STIs,  including HIV (human immunodeficiency virus). Female condoms can only be worn once. They should not be used with oil-based lubricants like petroleum jelly, lotions, or oils, because these lubricants make them less effective. They can be used with water-based lubricants available from your health care provider and over-the-counter. Water-based lubricants do not contain silicone, wax, or oil. Female condom  The female condom is a soft, loose-fitting sheath that is put into the vagina before sex. It is held in place by two closed inner rings, one at the cervix and the other at the vaginal opening. The female condom prevents pregnancy by catching sperm and blocking its passage to the uterus. It also helps to protect against STIs. A female condom is intended for one-time use only. It can be inserted as long as 8 hours before sex. You should not use a female condom while your partner uses a female condom because the condoms can stick to each other and break. Diaphragm  A diaphragm is a soft, latex or silicone dome-shaped barrier that is placed in the vagina with spermicidal jelly before sex. A diaphragm covers the cervix, kills sperm, and blocks the passage of sperm into the cervix. It does not protect against STIs. This barrier method requires a prescription and must be fitted by a health care provider. A diaphragm can be inserted up to 2 hours before sex. If it is inserted more than 2 hours before sex, the spermicide must be applied again. A diaphragm should be left in the vagina for 6-8 hours after sex. Before sex can occur again during these 6 hours, spermicide must be reapplied. A diaphragm should not be used during a menstrual period. Cervical cap  A cervical cap is a round, soft, latex or plastic cup that is put in the vagina and fits over the cervix. It stays in place by the use of suction. A cervical cap should be used with a spermicide. It provides continuous protection as long as it is in place,  regardless of how many times you have sex. It does not protect against STIs. Cervical caps may be inserted as long as 6 hours before sexual activity. They must be left in place for at least 6 hours after sex and can be left in place for as long as 48 hours. A cervical cap must be fitted by a health care provider. If you lose or gain a certain amount of weight your health care provider may need to refit the cap. You cannot use a cervical cap during your period. Sponge  A sponge is a soft, circular piece of polyurethane foam that has spermicide in it. It is made wet with clean water and then placed into the vagina and over the cervix before sex. The foam  is designed to trap and absorb sperm before it enters the cervix while the spermicide kills or immobilizes sperm. A sponge offers an immediate and continuous presence of spermicide throughout a 24-hour period no matter how many times you have sex. It does not protect against STIs. A sponge should be left in place for at least 6 hours after sex. It should not be left in for more than 24 hours, and it cannot be reused. It has a loop for removal. This barrier method can be purchased over-the-counter. You may use it if you are breastfeeding. Do not use if you have your period. Spermicide Spermicides are chemicals that kill or block sperm from entering the cervix and uterus. They are inserted into the vagina with an applicator before sex. Spermicides do not protect against STIs. Spermicides come as creams, jellies, suppositories, foam, film, or tablets. Suppositories, film, and tablets should be inserted 10-30 minutes before sex so they can dissolve. To be effective, a new spermicide must be inserted every time you have sex. Summary  A barrier method is a type of birth control (contraception) that is used to prevent pregnancy.  Your health care provider can help you decide what form of contraception is best for you.  Always keep in mind that the risk of an  STI (sexually transmitted infection) exists even when a contraceptive barrier method is used. This information is not intended to replace advice given to you by your health care provider. Make sure you discuss any questions you have with your health care provider. Document Revised: 06/24/2017 Document Reviewed: 05/31/2016 Elsevier Patient Education  2020 Elsevier Inc.  Lactic acid; Citric acid; Potassium bitartrate vaginal gel What is this medicine? LACTIC ACID; CITRIC ACID; POTASSIUM BITARTRATE (LAK tuhk AS id; SIH trik AS id; poe TASS ee um bi TAAR treit) is used for birth control when you have sexual intercourse to help prevent pregnancy. This medicine may be used for other purposes; ask your health care provider or pharmacist if you have questions. COMMON BRAND NAME(S): PHEXXI What should I tell my health care provider before I take this medicine? They need to know if you have any of these conditions:  frequent kidney or urinary tract infections  HIV or AIDS  pelvic or vaginal infection  sexually transmitted disease, like herpes, gonorrhea, or chlamydia  an unusual or allergic reaction to lactic acid, citric acid, potassium bitartrate, other medicines, foods, dyes, or preservatives  pregnant or trying to get pregnant  breast-feeding How should I use this medicine? This medicine is for use in the vagina only. Follow the directions on the prescription label. Wash hands before and after use. To prevent pregnancy, it is very important that this product is used properly. This product must be applied before sexual contact begins. Do not use it in the rectum. Make sure you carefully read and follow the instructions that come with the product. If you have vaginal sex more than 1 time within 1 hour, you must use a new applicator. Use exactly as directed. Talk to your pediatrician regarding the use of this medicine in children. Special care may be needed. Overdosage: If you think you have  taken too much of this medicine contact a poison control center or emergency room at once. NOTE: This medicine is only for you. Do not share this medicine with others. What if I miss a dose? Use before each time you have sexual intercourse as directed on the label. If you miss a dose, you may become  pregnant. What may interact with this medicine? Interactions are not expected. This birth control may be used with other medicines used in the vagina to treat infections including miconazole, metronidazole, and tioconazole. If you have questions ask your doctor or pharmacist. Do not use any other vaginal products at the same time without talking to your health care professional. This list may not describe all possible interactions. Give your health care provider a list of all the medicines, herbs, non-prescription drugs, or dietary supplements you use. Also tell them if you smoke, drink alcohol, or use illegal drugs. Some items may interact with your medicine. What should I watch for while using this medicine? This product does not protect you against HIV infection (AIDS) or other sexually transmitted diseases. This product may be used at any time of your menstrual cycle. This product may be used as soon as your healthcare provider tells you it is safe for you to have sex after childbirth, abortion, or miscarriage. This product may be used with most hormonal birth control methods; a vaginal diaphragm; or latex, polyurethane and polyisoprene condoms. Avoid using this product with contraceptive vaginal rings. What side effects may I notice from receiving this medicine? Side effects that you should report to your doctor or health care professional as soon as possible:  allergic reactions like skin rash, itching or hives, swelling  signs and symptoms of infection like fever; chills; pelvic pain; cloudy urine; pain or trouble passing urine  vaginal discharge, itching or odor  vaginal irritation or  burning Side effects that usually do not require medical attention (report these to your doctor or health care professional if they continue or are bothersome):  mild vaginal irritation This list may not describe all possible side effects. Call your doctor for medical advice about side effects. You may report side effects to FDA at 1-800-FDA-1088. Where should I keep my medicine? Keep out of the reach of children. Store at room temperature between 15 and 30 degrees C (59 and 86 degrees F). Keep in the foil pouch until ready for use. Follow the directions on the product label. Throw away any unused medicine after the expiration date. NOTE: This sheet is a summary. It may not cover all possible information. If you have questions about this medicine, talk to your doctor, pharmacist, or health care provider.  2020 Elsevier/Gold Standard (2018-12-21 09:34:36)

## 2019-12-10 ENCOUNTER — Encounter: Payer: Self-pay | Admitting: Certified Nurse Midwife

## 2019-12-10 ENCOUNTER — Ambulatory Visit (INDEPENDENT_AMBULATORY_CARE_PROVIDER_SITE_OTHER): Payer: Medicaid Other | Admitting: Certified Nurse Midwife

## 2019-12-10 ENCOUNTER — Other Ambulatory Visit: Payer: Self-pay

## 2019-12-10 VITALS — BP 88/63 | HR 82 | Ht 64.0 in | Wt 214.4 lb

## 2019-12-10 DIAGNOSIS — M7989 Other specified soft tissue disorders: Secondary | ICD-10-CM | POA: Diagnosis not present

## 2019-12-10 LAB — POCT URINALYSIS DIPSTICK
Bilirubin, UA: NEGATIVE
Glucose, UA: NEGATIVE
Ketones, UA: NEGATIVE
Leukocytes, UA: NEGATIVE
Nitrite, UA: NEGATIVE
Protein, UA: NEGATIVE
Spec Grav, UA: 1.015 (ref 1.010–1.025)
Urobilinogen, UA: 0.2 E.U./dL
pH, UA: 5 (ref 5.0–8.0)

## 2019-12-10 NOTE — Patient Instructions (Signed)

## 2019-12-10 NOTE — Progress Notes (Signed)
Subjective:    Tracy Patel is a 34 y.o. [id number] African American female who presents for a postpartum visit. She is 1 week postpartum following a spontaneous vaginal delivery. She is following up for positive Homn's sign post delivery with negative u/trasound. She has bilateral leg swelling. She state that it worsen throughout theday. She states that she is drinking approximately 64oz a day. Encouraged that she needs to drink more due to breastfeeding and postpartum bleeding . Homans sign today negative, no redness . Bilateral pitting edema +1. No redness or tenderness with palpation. Encouraged pt to continue to use compresion socks and to elevate feet when possible.    The following portions of the patient's history were reviewed and updated as appropriate: allergies, current medications, past medical history, past surgical history and problem list.  Review of Systems Pertinent items are noted in HPI.   Vitals:   12/10/19 1429  BP: (!) 88/63  Pulse: 82  Weight: 214 lb 7 oz (97.3 kg)  Height: 5\' 4"  (1.626 m)   No LMP recorded.  Objective:   Legs: Bilateral pitting edema +1 Reflexes +1 bilateral No tenderness or warmth to touch Negative homans' sign bilaterally        Assessment:   Postpartum leg swelling  wks s/p SVD Breast feeding   Plan:  She has 1 wk tele visit scheduled  Follow up in: 5 weeks for postpartum visit or earlier if needed  , CNM

## 2019-12-17 ENCOUNTER — Ambulatory Visit (INDEPENDENT_AMBULATORY_CARE_PROVIDER_SITE_OTHER): Payer: Medicaid Other | Admitting: Certified Nurse Midwife

## 2019-12-17 ENCOUNTER — Other Ambulatory Visit: Payer: Self-pay

## 2019-12-17 DIAGNOSIS — Z1331 Encounter for screening for depression: Secondary | ICD-10-CM

## 2019-12-17 NOTE — Patient Instructions (Signed)

## 2019-12-17 NOTE — Progress Notes (Signed)
atient called for a 2 week televisit. DOB as identifier. Patient is both bottle feeding and breast feeding and doing well. Menstrual flow has decreased. PhQ9= 3. Call transerferred to Doreene Burke CNM for televisit.

## 2019-12-17 NOTE — Progress Notes (Signed)
Virtual Visit via Telephone Note  I connected with Tracy Patel on 12/17/19 at 11:30 AM EDT by telephone and verified that I am speaking with the correct person using two identifiers.   I discussed the limitations, risks, security and privacy concerns of performing an evaluation and management service by telephone and the availability of in person appointments. I also discussed with the patient that there may be a patient responsible charge related to this service. The patient expressed understanding and agreed to proceed.  Present on phone call Patient @ home [facility] nurse @ office Doreene Burke, [place]  @ office   History of Present Illness: 34 yr old 2 week post partum depression screening   Observations/Objective: Patient is 34 yr old 2 wks postpartum SVD @ 40.2   Assessment and Plan: She is doing well. States she is breast and supplementing. Her bleeding has slowed down a lot and she states she is having not pain. Her partner is not with her at night but comes and helps her during the day which allows her to get a good nap.   Gad score 3   Follow Up Instructions: 4 wks for PPV    I discussed the assessment and treatment plan with the patient. The patient was provided an opportunity to ask questions and all were answered. The patient agreed with the plan and demonstrated an understanding of the instructions.   The patient was advised to call back or seek an in-person evaluation if the symptoms worsen or if the condition fails to improve as anticipated.  I provided 9 minutes of non-face-to-face time during this encounter.   Doreene Burke, CNM

## 2020-01-08 ENCOUNTER — Encounter: Payer: Self-pay | Admitting: Certified Nurse Midwife

## 2020-01-08 ENCOUNTER — Ambulatory Visit (INDEPENDENT_AMBULATORY_CARE_PROVIDER_SITE_OTHER): Payer: Medicaid Other | Admitting: Certified Nurse Midwife

## 2020-01-08 ENCOUNTER — Other Ambulatory Visit: Payer: Self-pay

## 2020-01-08 NOTE — Progress Notes (Signed)
Subjective:    Tracy Patel is a 33 y.o. G9P2012 African American female who presents for a postpartum visit. She is 6 weeks postpartum following a spontaneous vaginal delivery at 40.2 gestational weeks. Anesthesia: none. I have fully reviewed the prenatal and intrapartum course. Postpartum course has been WNL. Baby's course has been WNL. Baby is feeding by breast. Bleeding no bleeding. Bowel function is normal. Bladder function is normal. Patient is not sexually active. Last sexual activity: prior to delivery. Contraception method is IUD.Want s Tracy Patel Guard will follow up for placement.  Postpartum depression screening: negative. Score 1.  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 Logan Patel 07/18/19. Needs repeat 3 months pp.   The following portions of the patient's history were reviewed and updated as appropriate: allergies, current medications, past medical history, past surgical history and problem list.  Review of Systems Pertinent items are noted in HPI.   There were no vitals filed for this visit. No LMP recorded.  Objective:   General:  alert, cooperative and no distress   Breasts:  deferred, no complaints  Lungs: clear to auscultation bilaterally  Heart:  regular rate and rhythm  Abdomen: soft, nontender   Vulva: normal  Vagina: normal vagina  Cervix:  closed  Corpus: Well-involuted  Adnexa:  Non-palpable  Rectal Exam: no hemorrhoids        Assessment:   Postpartum exam 6 wks s/p SVD breastfeeding Depression screening Contraception counseling   Plan:  : IUD- wants copper IUD, discussed procedure for placement. Encouraged motrin prior to appointment.  Follow up prn for ParaGard placement and in 3 months with Dr. Logan Patel for colposcopy. or earlier if needed  Tracy Patel, CNM

## 2020-01-08 NOTE — Patient Instructions (Signed)

## 2020-01-16 ENCOUNTER — Other Ambulatory Visit: Payer: Self-pay

## 2020-01-16 ENCOUNTER — Ambulatory Visit (INDEPENDENT_AMBULATORY_CARE_PROVIDER_SITE_OTHER): Payer: Medicaid Other | Admitting: Certified Nurse Midwife

## 2020-01-16 ENCOUNTER — Encounter: Payer: Self-pay | Admitting: Certified Nurse Midwife

## 2020-01-16 VITALS — BP 120/86 | HR 68 | Ht 64.0 in | Wt 210.3 lb

## 2020-01-16 DIAGNOSIS — Z30430 Encounter for insertion of intrauterine contraceptive device: Secondary | ICD-10-CM

## 2020-01-16 NOTE — Patient Instructions (Signed)
Intrauterine Device Insertion, Care After  This sheet gives you information about how to care for yourself after your procedure. Your health care provider may also give you more specific instructions. If you have problems or questions, contact your health care provider. What can I expect after the procedure? After the procedure, it is common to have:  Cramps and pain in the abdomen.  Light bleeding (spotting) or heavier bleeding that is like your menstrual period. This may last for up to a few days.  Lower back pain.  Dizziness.  Headaches.  Nausea. Follow these instructions at home:  Before resuming sexual activity, check to make sure that you can feel the IUD string(s). You should be able to feel the end of the string(s) below the opening of your cervix. If your IUD string is in place, you may resume sexual activity. ? If you had a hormonal IUD inserted more than 7 days after your most recent period started, you will need to use a backup method of birth control for 7 days after IUD insertion. Ask your health care provider whether this applies to you.  Continue to check that the IUD is still in place by feeling for the string(s) after every menstrual period, or once a month.  Take over-the-counter and prescription medicines only as told by your health care provider.  Do not drive or use heavy machinery while taking prescription pain medicine.  Keep all follow-up visits as told by your health care provider. This is important. Contact a health care provider if:  You have bleeding that is heavier or lasts longer than a normal menstrual cycle.  You have a fever.  You have cramps or abdominal pain that get worse or do not get better with medicine.  You develop abdominal pain that is new or is not in the same area of earlier cramping and pain.  You feel lightheaded or weak.  You have abnormal or bad-smelling discharge from your vagina.  You have pain during sexual  activity.  You have any of the following problems with your IUD string(s): ? The string bothers or hurts you or your sexual partner. ? You cannot feel the string. ? The string has gotten longer.  You can feel the IUD in your vagina.  You think you may be pregnant, or you miss your menstrual period.  You think you may have an STI (sexually transmitted infection). Get help right away if:  You have flu-like symptoms.  You have a fever and chills.  You can feel that your IUD has slipped out of place. Summary  After the procedure, it is common to have cramps and pain in the abdomen. It is also common to have light bleeding (spotting) or heavier bleeding that is like your menstrual period.  Continue to check that the IUD is still in place by feeling for the string(s) after every menstrual period, or once a month.  Keep all follow-up visits as told by your health care provider. This is important.  Contact your health care provider if you have problems with your IUD string(s), such as the string getting longer or bothering you or your sexual partner. This information is not intended to replace advice given to you by your health care provider. Make sure you discuss any questions you have with your health care provider. Document Revised: 06/24/2017 Document Reviewed: 06/02/2016 Elsevier Patient Education  2020 [employer]  

## 2020-01-16 NOTE — Progress Notes (Addendum)
   GYNECOLOGY OFFICE PROCEDURE NOTE  NATEISHA MOYD is a 34 y.o. [id number] here for Para Guard UD insertion. No GYN concerns.  Last pap smear was on 05/23/19 and was abnormal .  IUD Insertion Procedure Note Patient identified, informed consent performed, consent signed.   Discussed risks of irregular bleeding, cramping, infection, malpositioning or misplacement of the IUD outside the uterus which may require further procedure such as laparoscopy. Also discussed >99% contraception efficacy, increased risk of ectopic pregnancy with failure of method.  Time out was performed.  Urine pregnancy test negative.  Speculum placed in the vagina.  Cervix visualized.  Cleaned with Betadine x 2.  Grasped anteriorly with a single tooth tenaculum.  Uterus sounded to 9 cm.  Para Guard IUD placed per manufacturer's recommendations.  Strings trimmed to 3 cm. Tenaculum was removed, good hemostasis noted.  Patient tolerated procedure well.   Patient was given post-procedure instructions.  She was advised to have backup contraception for one week.  Patient was also asked to check IUD strings periodically and follow up in 4 weeks for IUD check.   Doreene Burke, CNM

## 2020-02-13 ENCOUNTER — Other Ambulatory Visit: Payer: Self-pay

## 2020-02-13 ENCOUNTER — Encounter: Payer: Self-pay | Admitting: Certified Nurse Midwife

## 2020-02-13 ENCOUNTER — Ambulatory Visit (INDEPENDENT_AMBULATORY_CARE_PROVIDER_SITE_OTHER): Payer: Medicaid Other | Admitting: Certified Nurse Midwife

## 2020-02-13 VITALS — BP 99/76 | HR 75 | Ht 64.0 in | Wt 210.4 lb

## 2020-02-13 DIAGNOSIS — Z30431 Encounter for routine checking of intrauterine contraceptive device: Secondary | ICD-10-CM | POA: Diagnosis not present

## 2020-02-13 NOTE — Patient Instructions (Signed)

## 2020-02-13 NOTE — Progress Notes (Addendum)
    GYNECOLOGY OFFICE ENCOUNTER NOTE  History:  34 y.o. [id number] here today for today for IUD string check; Paragard  IUD was placed  01/16/20. No complaints about the IUD, no concerning side effects. Is having bleeding . Discussed use of lysteda if needed.   The following portions of the patient's history were reviewed and updated as appropriate: allergies, current medications, past family history, past medical history, past social history, past surgical history and problem list. Last pap smear on 05/23/19 was abnormal  Review of Systems:  Pertinent items are noted in HPI.   Objective:  Physical Exam Blood pressure 99/76, pulse 75, height 5\' 4"  (1.626 m), weight 210 lb 6 oz (95.4 kg), currently breastfeeding. CONSTITUTIONAL: Well-developed, well-nourished female in no acute distress.  HENT:  Normocephalic, atraumatic. External right and left ear normal. Oropharynx is clear and moist EYES: Conjunctivae and EOM are normal. Pupils are equal, round, and reactive to light. No scleral icterus.  NECK: Normal range of motion, supple, no masses CARDIOVASCULAR: Normal heart rate noted RESPIRATORY: Effort and breath sounds normal, no problems with respiration noted ABDOMEN: Soft, no distention noted.   PELVIC: Normal appearing external genitalia; normal appearing vaginal mucosa and cervix.  IUD strings visualized, about 2 cm in length outside cervix.   Assessment & Plan:  Patient to keep IUD in place for up to ten years; can come in for removal if she desires pregnancy earlier or for any concerning side effects. Call if she would like to try lysteda to manage bleeding.    , CNM

## 2020-04-10 ENCOUNTER — Encounter: Payer: Medicaid Other | Admitting: Obstetrics and Gynecology

## 2020-04-15 ENCOUNTER — Encounter: Payer: Medicaid Other | Admitting: Obstetrics and Gynecology

## 2020-04-29 ENCOUNTER — Encounter: Payer: Medicaid Other | Admitting: Obstetrics and Gynecology

## 2020-05-08 ENCOUNTER — Encounter: Payer: Medicaid Other | Admitting: Obstetrics and Gynecology

## 2020-05-21 ENCOUNTER — Other Ambulatory Visit: Payer: Self-pay

## 2020-05-21 ENCOUNTER — Encounter: Payer: Self-pay | Admitting: Obstetrics and Gynecology

## 2020-05-21 ENCOUNTER — Ambulatory Visit (INDEPENDENT_AMBULATORY_CARE_PROVIDER_SITE_OTHER): Payer: Medicaid Other | Admitting: Obstetrics and Gynecology

## 2020-05-21 ENCOUNTER — Other Ambulatory Visit (HOSPITAL_COMMUNITY)
Admission: RE | Admit: 2020-05-21 | Discharge: 2020-05-21 | Disposition: A | Discharge status: Home / Self Care | Payer: Medicaid Other | Source: Ambulatory Visit | Attending: Obstetrics and Gynecology | Admitting: Obstetrics and Gynecology

## 2020-05-21 VITALS — BP 147/81 | HR 87 | Ht 64.0 in | Wt 216.6 lb

## 2020-05-21 DIAGNOSIS — R87613 High grade squamous intraepithelial lesion on cytologic smear of cervix (HGSIL): Secondary | ICD-10-CM | POA: Diagnosis not present

## 2020-05-21 NOTE — Addendum Note (Signed)
Addended by: Dorian Pod on: 05/21/2020 02:43 PM   Modules accepted: Orders
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
## 2020-05-26 LAB — SURGICAL PATHOLOGY

## 2020-06-12 ENCOUNTER — Other Ambulatory Visit: Payer: Self-pay

## 2020-06-12 ENCOUNTER — Encounter: Payer: Self-pay | Admitting: Obstetrics and Gynecology

## 2020-06-12 ENCOUNTER — Ambulatory Visit (INDEPENDENT_AMBULATORY_CARE_PROVIDER_SITE_OTHER): Payer: Medicaid Other | Admitting: Obstetrics and Gynecology

## 2020-06-12 VITALS — BP 135/96 | HR 82 | Ht 64.0 in | Wt 212.0 lb

## 2020-06-12 DIAGNOSIS — N871 Moderate cervical dysplasia: Secondary | ICD-10-CM
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
## 2020-12-16 ENCOUNTER — Encounter: Payer: Medicaid Other | Admitting: Obstetrics and Gynecology

## 2020-12-30 ENCOUNTER — Ambulatory Visit (INDEPENDENT_AMBULATORY_CARE_PROVIDER_SITE_OTHER): Payer: Medicaid Other | Admitting: Obstetrics and Gynecology

## 2020-12-30 ENCOUNTER — Encounter: Payer: Self-pay | Admitting: Obstetrics and Gynecology

## 2020-12-30 ENCOUNTER — Other Ambulatory Visit: Payer: Self-pay

## 2020-12-30 ENCOUNTER — Other Ambulatory Visit (HOSPITAL_COMMUNITY)
Admission: RE | Admit: 2020-12-30 | Discharge: 2020-12-30 | Disposition: A | Discharge status: Home / Self Care | Payer: Medicaid Other | Source: Ambulatory Visit | Attending: Obstetrics and Gynecology | Admitting: Obstetrics and Gynecology

## 2020-12-30 VITALS — BP 122/81 | HR 73 | Ht 64.0 in | Wt 202.1 lb

## 2020-12-30 DIAGNOSIS — N871 Moderate cervical dysplasia: Secondary | ICD-10-CM | POA: Diagnosis present

## 2020-12-30 DIAGNOSIS — N898 Other specified noninflammatory disorders of vagina: Secondary | ICD-10-CM

## 2020-12-30 NOTE — Addendum Note (Signed)
Addended by: Dorian Pod on: 12/30/2020 09:08 AM   Modules accepted: Orders
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
## 2020-12-31 LAB — SURGICAL PATHOLOGY

## 2020-12-31 LAB — CERVICOVAGINAL ANCILLARY ONLY
Bacterial Vaginitis (gardnerella): POSITIVE — AB
Candida Glabrata: NEGATIVE
Candida Vaginitis: NEGATIVE
Chlamydia: NEGATIVE
Comment: NEGATIVE
Comment: NEGATIVE
Comment: NEGATIVE
Comment: NEGATIVE
Comment: NEGATIVE
Comment: NORMAL
Neisseria Gonorrhea: NEGATIVE
Trichomonas: NEGATIVE

## 2021-01-07 ENCOUNTER — Other Ambulatory Visit: Payer: Self-pay | Admitting: Surgical

## 2021-01-07 MED ORDER — METRONIDAZOLE 500 MG PO TABS: 500.0000 mg | ORAL_TABLET | Freq: Two times a day (BID) | ORAL | 0 refills | Status: DC

## 2021-01-15 ENCOUNTER — Encounter: Payer: Medicaid Other | Admitting: Obstetrics and Gynecology

## 2021-03-25 IMAGING — US US EXTREM LOW VENOUS*R*
1 series · 13 of 24 positions shown · non-contrast
Comparison: None.

CLINICAL DATA: Right lower extremity pain and edema



[Series 1: us venous img lower uni right (dvt) · portal-venous · 13 of 27 slices shown]
[im 1/27]
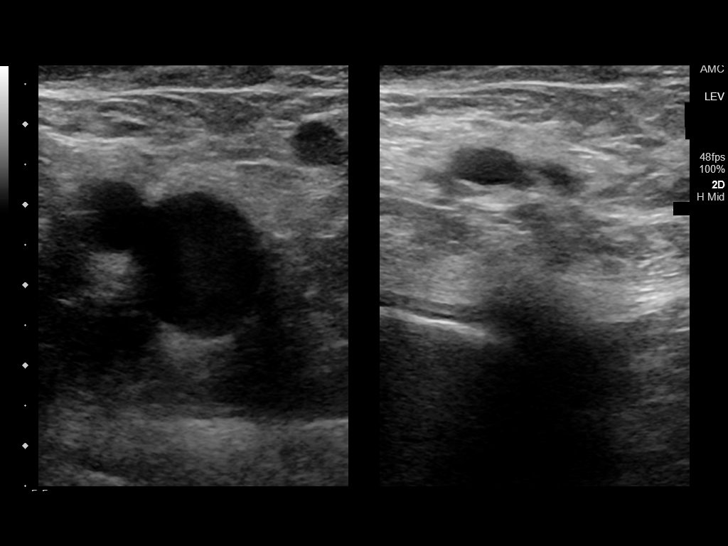
[im 3/27]
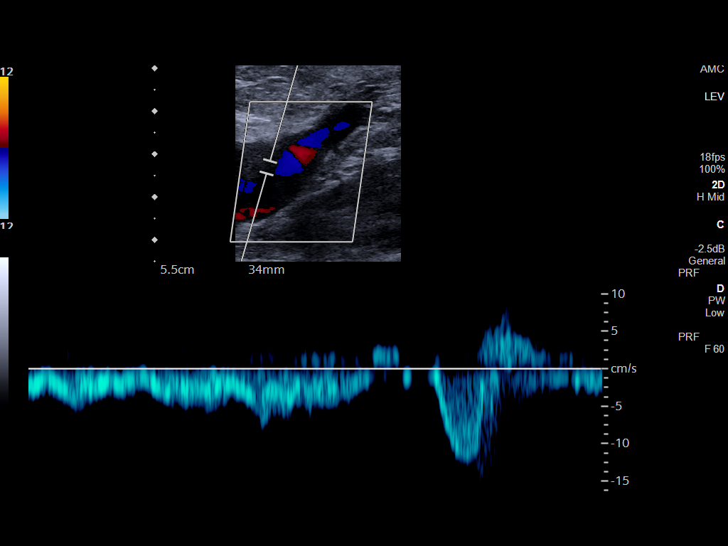
[im 5/27]
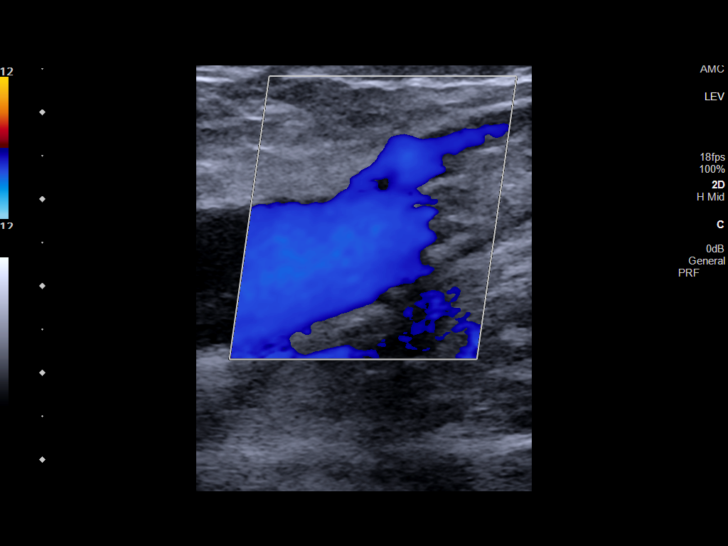
[im 7/27]
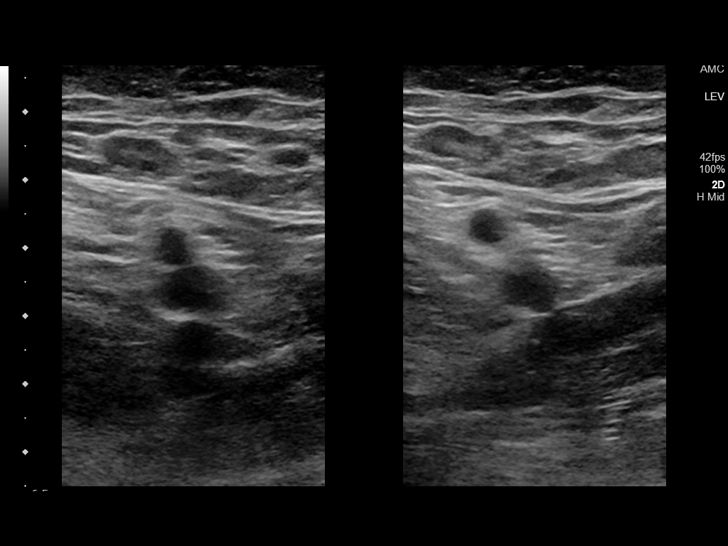
[im 10/27]
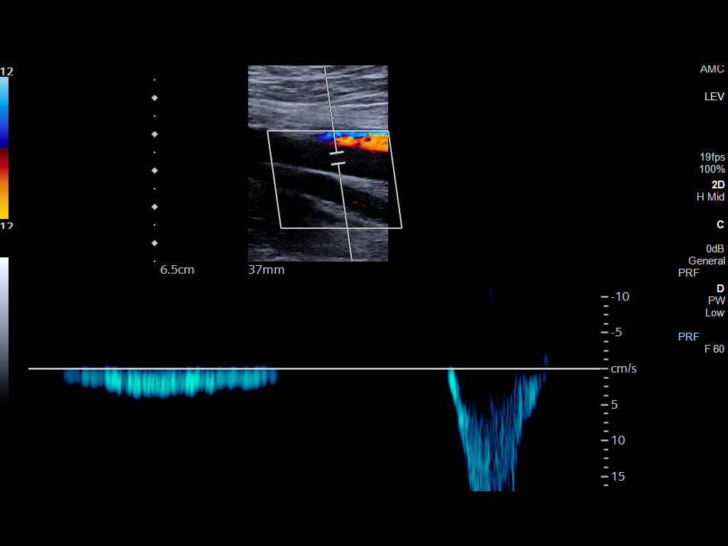
[im 12/27]
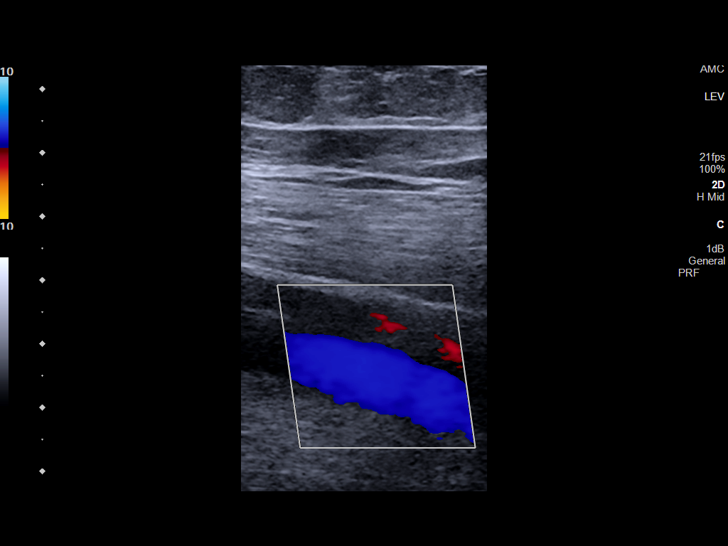
[im 14/27]
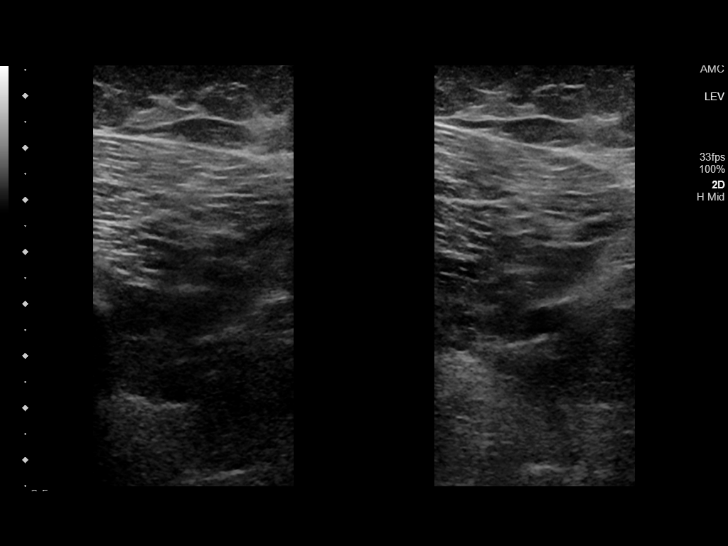
[im 15/27]
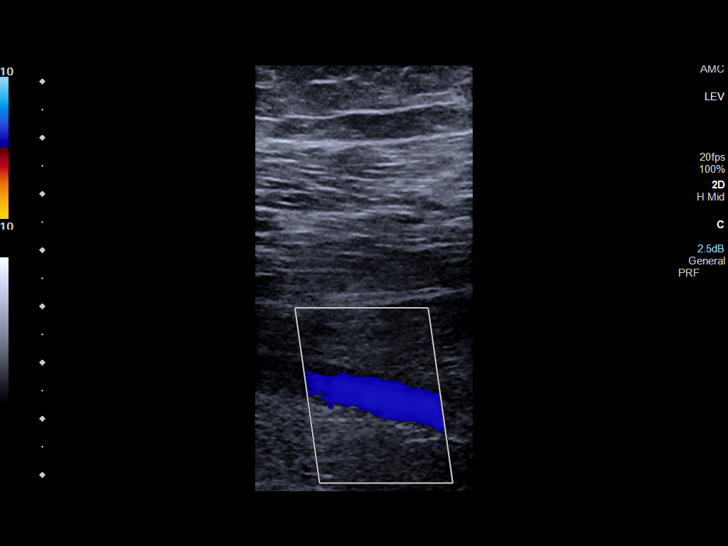
[im 17/27]
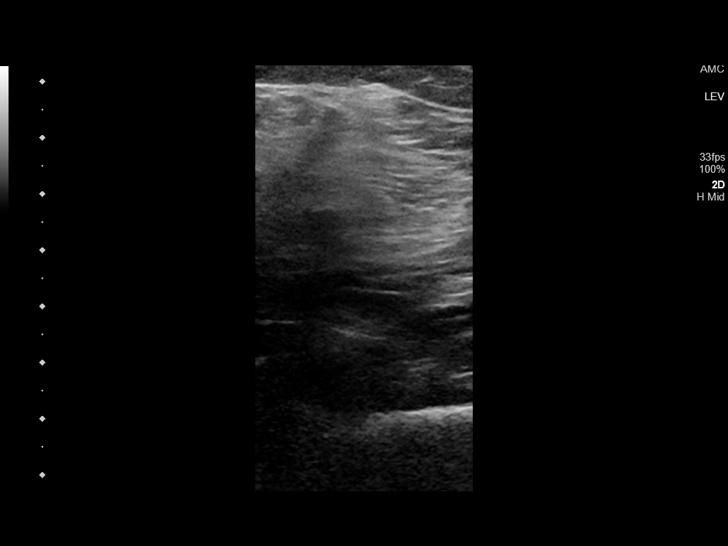
[im 20/27]
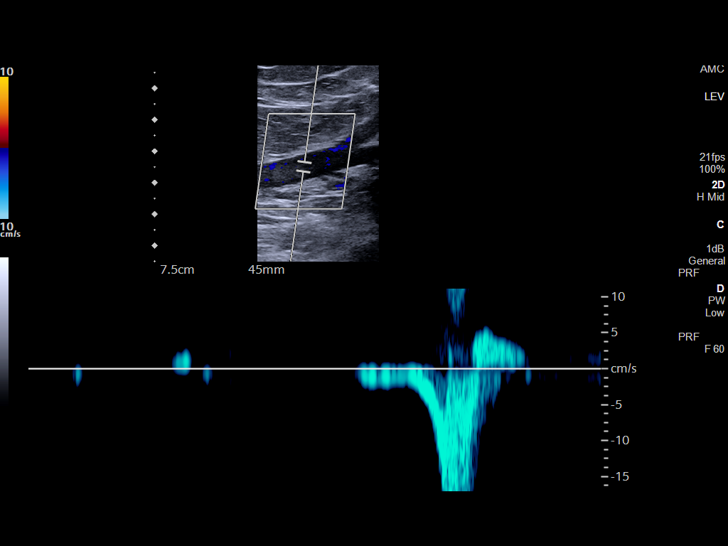
[im 22/27]
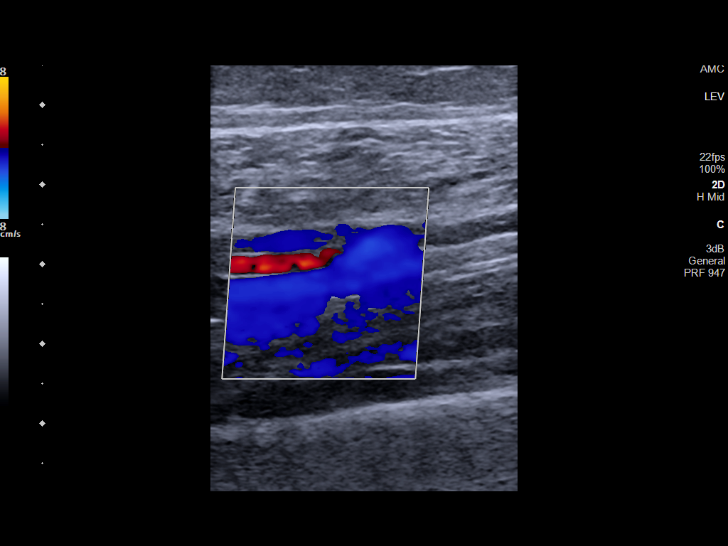
[im 24/27]
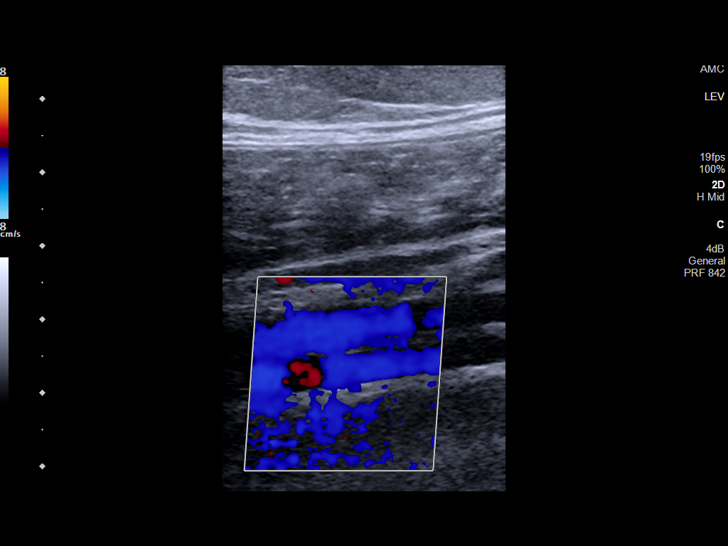
[im 27/27]
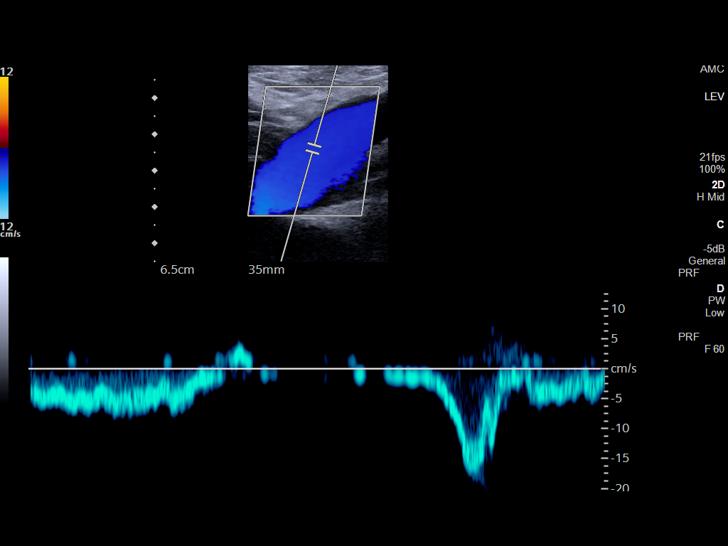

[13 of 24 positions shown; findings below may reference images not displayed]

FINDINGS: Contralateral Common Femoral Vein: Respiratory phasicity is normal
and symmetric with the symptomatic side. No evidence of thrombus.
Normal compressibility.

Common Femoral Vein: No evidence of thrombus. Normal
compressibility, respiratory phasicity and response to augmentation.

Saphenofemoral Junction: No evidence of thrombus. Normal
compressibility and flow on color Doppler imaging.

Profunda Femoral Vein: No evidence of thrombus. Normal
compressibility and flow on color Doppler imaging.

Femoral Vein: No evidence of thrombus. Normal compressibility,
respiratory phasicity and response to augmentation.

Popliteal Vein: No evidence of thrombus. Normal compressibility,
respiratory phasicity and response to augmentation.

Calf Veins: No evidence of thrombus. Normal compressibility and flow
on color Doppler imaging.
IMPRESSION: No evidence of deep venous thrombosis.

## 2021-07-09 ENCOUNTER — Encounter: Payer: Self-pay | Admitting: Obstetrics and Gynecology

## 2021-07-09 ENCOUNTER — Other Ambulatory Visit (HOSPITAL_COMMUNITY)
Admission: RE | Admit: 2021-07-09 | Discharge: 2021-07-09 | Disposition: A | Discharge status: Home / Self Care | Payer: Medicaid Other | Source: Ambulatory Visit | Attending: Obstetrics and Gynecology | Admitting: Obstetrics and Gynecology

## 2021-07-09 ENCOUNTER — Ambulatory Visit (INDEPENDENT_AMBULATORY_CARE_PROVIDER_SITE_OTHER): Payer: Medicaid Other | Admitting: Obstetrics and Gynecology

## 2021-07-09 ENCOUNTER — Other Ambulatory Visit: Payer: Self-pay

## 2021-07-09 VITALS — BP 127/81 | HR 74 | Ht 64.0 in | Wt 191.9 lb

## 2021-07-09 DIAGNOSIS — N870 Mild cervical dysplasia: Secondary | ICD-10-CM | POA: Diagnosis not present

## 2021-07-09 DIAGNOSIS — R87619 Unspecified abnormal cytological findings in specimens from cervix uteri: Secondary | ICD-10-CM | POA: Insufficient documentation
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
## 2021-07-14 ENCOUNTER — Other Ambulatory Visit: Payer: Self-pay

## 2021-07-14 DIAGNOSIS — R87619 Unspecified abnormal cytological findings in specimens from cervix uteri: Secondary | ICD-10-CM

## 2021-07-15 ENCOUNTER — Other Ambulatory Visit: Payer: Self-pay

## 2021-07-15 DIAGNOSIS — R87619 Unspecified abnormal cytological findings in specimens from cervix uteri: Secondary | ICD-10-CM | POA: Diagnosis present

## 2021-07-16 LAB — SURGICAL PATHOLOGY

## 2021-07-19 NOTE — Progress Notes (Signed)
Tracy Patel: This shows the continued presence of CIN-1.  This is the same as it was last year.  I would recommend a follow-up colposcopy in 6 months.

## 2021-07-23 ENCOUNTER — Encounter: Payer: Self-pay | Admitting: Obstetrics and Gynecology

## 2021-07-23 ENCOUNTER — Other Ambulatory Visit: Payer: Self-pay

## 2021-07-23 ENCOUNTER — Ambulatory Visit (INDEPENDENT_AMBULATORY_CARE_PROVIDER_SITE_OTHER): Payer: Medicaid Other | Admitting: Obstetrics and Gynecology

## 2021-07-23 VITALS — BP 131/78 | HR 73 | Ht 64.0 in | Wt 194.7 lb

## 2021-07-23 DIAGNOSIS — N870 Mild cervical dysplasia: Secondary | ICD-10-CM | POA: Diagnosis not present
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
## 2021-07-23 NOTE — Progress Notes (Signed)
Patient presents for 2 week follow-up. Patient states no questions or concerns at this time.

## 2021-10-08 ENCOUNTER — Encounter: Payer: Self-pay | Admitting: Obstetrics and Gynecology

## 2022-07-27 ENCOUNTER — Other Ambulatory Visit: Payer: Self-pay | Admitting: Obstetrics and Gynecology

## 2022-07-27 ENCOUNTER — Other Ambulatory Visit (HOSPITAL_COMMUNITY)
Admission: RE | Admit: 2022-07-27 | Discharge: 2022-07-27 | Disposition: A | Discharge status: Home / Self Care | Payer: Medicaid Other | Source: Ambulatory Visit | Attending: Obstetrics and Gynecology | Admitting: Obstetrics and Gynecology

## 2022-07-27 ENCOUNTER — Ambulatory Visit (INDEPENDENT_AMBULATORY_CARE_PROVIDER_SITE_OTHER): Payer: Medicaid Other | Admitting: Obstetrics and Gynecology

## 2022-07-27 ENCOUNTER — Encounter: Payer: Self-pay | Admitting: Obstetrics and Gynecology

## 2022-07-27 VITALS — BP 134/82 | HR 67 | Ht 64.0 in | Wt 164.1 lb

## 2022-07-27 DIAGNOSIS — N921 Excessive and frequent menstruation with irregular cycle: Secondary | ICD-10-CM

## 2022-07-27 DIAGNOSIS — Z23 Encounter for immunization: Secondary | ICD-10-CM

## 2022-07-27 DIAGNOSIS — Z124 Encounter for screening for malignant neoplasm of cervix: Secondary | ICD-10-CM

## 2022-07-27 DIAGNOSIS — Z01419 Encounter for gynecological examination (general) (routine) without abnormal findings: Secondary | ICD-10-CM | POA: Insufficient documentation

## 2022-07-27 DIAGNOSIS — N870 Mild cervical dysplasia: Secondary | ICD-10-CM | POA: Diagnosis present
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
## 2022-07-27 NOTE — Progress Notes (Signed)
Patients presents for annual exam today. She states wanting to discuss becoming pregnant within the next year, currently has IUD with irregular cycles. Patient is due for pap smear, ordered. Flu shot administered today. Patient states no other questions or concerns at this time.

## 2022-07-29 ENCOUNTER — Ambulatory Visit
Admission: RE | Admit: 2022-07-29 | Discharge: 2022-07-29 | Disposition: A | Discharge status: Home / Self Care | Payer: Medicaid Other | Source: Ambulatory Visit | Attending: Obstetrics and Gynecology | Admitting: Obstetrics and Gynecology

## 2022-07-29 DIAGNOSIS — N921 Excessive and frequent menstruation with irregular cycle: Secondary | ICD-10-CM | POA: Insufficient documentation

## 2022-07-29 DIAGNOSIS — Z975 Presence of (intrauterine) contraceptive device: Secondary | ICD-10-CM | POA: Insufficient documentation

## 2022-07-30 LAB — CYTOLOGY - PAP
Comment: NEGATIVE
Comment: NEGATIVE
Comment: NEGATIVE
Diagnosis: UNDETERMINED — AB
HPV 16: NEGATIVE
HPV 18 / 45: NEGATIVE
High risk HPV: POSITIVE — AB

## 2022-07-30 NOTE — Progress Notes (Signed)
Taylor: Please let her know that the IUD looks to be correctly positioned!! And She has multiple small fibroids, Thanks

## 2022-07-31 NOTE — Progress Notes (Signed)
She will also need a FU colpo based on her latest pap.

## 2022-08-30 ENCOUNTER — Ambulatory Visit (INDEPENDENT_AMBULATORY_CARE_PROVIDER_SITE_OTHER): Payer: Self-pay | Admitting: Obstetrics & Gynecology

## 2022-08-30 ENCOUNTER — Other Ambulatory Visit (HOSPITAL_COMMUNITY)
Admission: RE | Admit: 2022-08-30 | Discharge: 2022-08-30 | Disposition: A | Discharge status: Home / Self Care | Payer: Medicaid Other | Source: Ambulatory Visit | Attending: Obstetrics & Gynecology | Admitting: Obstetrics & Gynecology

## 2022-08-30 ENCOUNTER — Encounter: Payer: Self-pay | Admitting: Obstetrics & Gynecology

## 2022-08-30 VITALS — BP 100/60 | Ht 64.0 in | Wt 160.0 lb

## 2022-08-30 DIAGNOSIS — Z8742 Personal history of other diseases of the female genital tract: Secondary | ICD-10-CM | POA: Diagnosis not present

## 2022-08-30 DIAGNOSIS — Z319 Encounter for procreative management, unspecified: Secondary | ICD-10-CM

## 2022-08-30 DIAGNOSIS — Z30432 Encounter for removal of intrauterine contraceptive device: Secondary | ICD-10-CM | POA: Diagnosis not present

## 2022-08-30 DIAGNOSIS — R87610 Atypical squamous cells of undetermined significance on cytologic smear of cervix (ASC-US): Secondary | ICD-10-CM

## 2022-08-30 DIAGNOSIS — Z87410 Personal history of cervical dysplasia: Secondary | ICD-10-CM | POA: Insufficient documentation

## 2022-08-30 DIAGNOSIS — R87810 Cervical high risk human papillomavirus (HPV) DNA test positive: Secondary | ICD-10-CM | POA: Diagnosis present

## 2022-08-30 DIAGNOSIS — N879 Dysplasia of cervix uteri, unspecified: Secondary | ICD-10-CM | POA: Diagnosis not present

## 2022-08-30 DIAGNOSIS — Z01419 Encounter for gynecological examination (general) (routine) without abnormal findings: Secondary | ICD-10-CM | POA: Diagnosis not present

## 2022-08-30 NOTE — Progress Notes (Signed)
   Established Patient Office Visit  Subjective   Patient ID: Tracy Patel, female    DOB: 1986/04/23  Age: 37 y.o. MRN: [id number]  Chief Complaint  Patient presents with   Colposcopy    HPI  37 yo P2 is here today for a colpo. She had a LEEP when she was 37yo. More recently she had biopsy-proven HGSIL in 2021, CIN 1 in 2022. Her pap done 07/27/2022 showed ASCUS + HR HPV (not 16 or 18).  She would like her Paragard IUD removed as she would like a pregnancy. She is taking MVI daily.  Objective:     BP 100/60   Ht 5\' 4"  (1.626 m)   Wt 160 lb (72.6 kg)   LMP 08/19/2022   BMI 27.46 kg/m    Physical Exam   Well nourished, well hydrated Black female, no apparent distress UPT negative, consent signed, time out done Graves speculum placed. Paragard strings grasped and intact IUD easily removed.  Cervix prepped with acetic acid. Transformation zone seen in its entirety. Colpo adequate. Colpo findings normal. ECC obtained. She tolerated the procedure well.    Assessment & Plan:   Desire for pregnancy- IUD removed, on MVIs, She will use withdrawal or condoms until [facility] results available.  ASCUS + HR HPV pap and normal colpo- if ECC is negative, then pap in  a year. If abnormal, then she will need a LEEP.  Problem List Items Addressed This Visit   None   No follow-ups on file.    Emily Filbert, MD

## 2022-08-30 NOTE — Addendum Note (Signed)
Addended by: Emily Filbert on: 08/30/2022 02:25 PM   Modules accepted: Orders

## 2022-08-31 ENCOUNTER — Encounter: Payer: Self-pay | Admitting: Obstetrics & Gynecology

## 2022-09-01 LAB — SURGICAL PATHOLOGY

## 2022-09-06 ENCOUNTER — Encounter: Payer: Self-pay | Admitting: Obstetrics & Gynecology

## 2022-11-21 ENCOUNTER — Encounter: Payer: Self-pay | Admitting: Obstetrics and Gynecology

## 2022-12-09 ENCOUNTER — Ambulatory Visit (INDEPENDENT_AMBULATORY_CARE_PROVIDER_SITE_OTHER): Payer: Self-pay | Admitting: Certified Nurse Midwife

## 2022-12-09 ENCOUNTER — Other Ambulatory Visit (HOSPITAL_COMMUNITY)
Admission: RE | Admit: 2022-12-09 | Discharge: 2022-12-09 | Disposition: A | Discharge status: Home / Self Care | Payer: Medicaid Other | Source: Ambulatory Visit | Attending: Certified Nurse Midwife | Admitting: Certified Nurse Midwife

## 2022-12-09 ENCOUNTER — Encounter: Payer: Self-pay | Admitting: Certified Nurse Midwife

## 2022-12-09 VITALS — BP 114/84 | HR 74 | Wt 152.6 lb

## 2022-12-09 DIAGNOSIS — N930 Postcoital and contact bleeding: Secondary | ICD-10-CM | POA: Insufficient documentation

## 2022-12-09 DIAGNOSIS — D259 Leiomyoma of uterus, unspecified: Secondary | ICD-10-CM
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
## 2022-12-10 LAB — CERVICOVAGINAL ANCILLARY ONLY
Bacterial Vaginitis (gardnerella): POSITIVE — AB
Candida Glabrata: NEGATIVE
Candida Vaginitis: NEGATIVE
Chlamydia: NEGATIVE
Comment: NEGATIVE
Comment: NEGATIVE
Comment: NEGATIVE
Comment: NEGATIVE
Comment: NEGATIVE
Comment: NORMAL
Neisseria Gonorrhea: NEGATIVE
Trichomonas: NEGATIVE

## 2022-12-13 ENCOUNTER — Other Ambulatory Visit: Payer: Self-pay | Admitting: Certified Nurse Midwife

## 2022-12-13 MED ORDER — METRONIDAZOLE 500 MG PO TABS: 500.0000 mg | ORAL_TABLET | Freq: Two times a day (BID) | ORAL | 0 refills | Status: AC

## 2023-08-02 ENCOUNTER — Ambulatory Visit (INDEPENDENT_AMBULATORY_CARE_PROVIDER_SITE_OTHER): Payer: Medicaid Other | Admitting: Obstetrics and Gynecology

## 2023-08-02 ENCOUNTER — Other Ambulatory Visit (HOSPITAL_COMMUNITY)
Admission: RE | Admit: 2023-08-02 | Discharge: 2023-08-02 | Disposition: A | Discharge status: Home / Self Care | Payer: Commercial Managed Care - HMO | Source: Ambulatory Visit | Attending: Obstetrics and Gynecology | Admitting: Obstetrics and Gynecology

## 2023-08-02 ENCOUNTER — Encounter: Payer: Self-pay | Admitting: Obstetrics and Gynecology

## 2023-08-02 VITALS — BP 145/93 | HR 77 | Ht 64.0 in | Wt 137.5 lb

## 2023-08-02 DIAGNOSIS — Z124 Encounter for screening for malignant neoplasm of cervix: Secondary | ICD-10-CM

## 2023-08-02 DIAGNOSIS — Z1151 Encounter for screening for human papillomavirus (HPV): Secondary | ICD-10-CM

## 2023-08-02 DIAGNOSIS — Z01419 Encounter for gynecological examination (general) (routine) without abnormal findings: Secondary | ICD-10-CM | POA: Insufficient documentation

## 2023-08-02 DIAGNOSIS — Z113 Encounter for screening for infections with a predominantly sexual mode of transmission: Secondary | ICD-10-CM | POA: Diagnosis present

## 2023-08-02 MED ORDER — LEVONORGEST-ETH ESTRAD 91-DAY 0.15-0.03 &0.01 MG PO TABS: 1.0000 | ORAL_TABLET | Freq: Every day | ORAL | 1 refills | Status: AC

## 2023-08-02 NOTE — Progress Notes (Signed)
 Patients presents for annual exam today. She states wanting to discuss cycle control today. Due for pap smear, ordered. Annual labs and STD screening are ordered. She states no other questions or concerns at this time.
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
## 2023-08-03 LAB — HEMOGLOBIN A1C
Est. average glucose Bld gHb Est-mCnc: 117 mg/dL
Hgb A1c MFr Bld: 5.7 % — ABNORMAL HIGH (ref 4.8–5.6)

## 2023-08-03 LAB — CBC
Hematocrit: 41.9 % (ref 34.0–46.6)
Hemoglobin: 13.7 g/dL (ref 11.1–15.9)
MCH: 29.3 pg (ref 26.6–33.0)
MCHC: 32.7 g/dL (ref 31.5–35.7)
MCV: 90 fL (ref 79–97)
Platelets: 348 10*3/uL (ref 150–450)
RBC: 4.67 x10E6/uL (ref 3.77–5.28)
RDW: 12.6 % (ref 11.7–15.4)
WBC: 3.2 10*3/uL — ABNORMAL LOW (ref 3.4–10.8)

## 2023-08-03 LAB — LIPID PANEL
Chol/HDL Ratio: 3.5 {ratio} (ref 0.0–4.4)
Cholesterol, Total: 209 mg/dL — ABNORMAL HIGH (ref 100–199)
HDL: 59 mg/dL (ref >39)
LDL Chol Calc (NIH): 143 mg/dL — ABNORMAL HIGH (ref 0–99)
Triglycerides: 38 mg/dL (ref 0–149)
VLDL Cholesterol Cal: 7 mg/dL (ref 5–40)

## 2023-08-03 LAB — RPR: RPR Ser Ql: NONREACTIVE

## 2023-08-03 LAB — TSH: TSH: 1.99 u[IU]/mL (ref 0.450–4.500)

## 2023-08-03 LAB — BASIC METABOLIC PANEL
BUN/Creatinine Ratio: 11 (ref 9–23)
BUN: 9 mg/dL (ref 6–20)
CO2: 24 mmol/L (ref 20–29)
Calcium: 9.6 mg/dL (ref 8.7–10.2)
Chloride: 103 mmol/L (ref 96–106)
Creatinine, Ser: 0.82 mg/dL (ref 0.57–1.00)
Glucose: 88 mg/dL (ref 70–99)
Potassium: 4.6 mmol/L (ref 3.5–5.2)
Sodium: 141 mmol/L (ref 134–144)
eGFR: 94 mL/min/{1.73_m2} (ref >59)

## 2023-08-03 LAB — HEPATITIS C ANTIBODY: Hep C Virus Ab: NONREACTIVE

## 2023-08-03 LAB — HIV ANTIBODY (ROUTINE TESTING W REFLEX): HIV Screen 4th Generation wRfx: NONREACTIVE

## 2023-08-03 LAB — HEPATITIS B SURFACE ANTIGEN: Hepatitis B Surface Ag: NEGATIVE

## 2023-08-04 LAB — CYTOLOGY - PAP
Chlamydia: NEGATIVE
Comment: NEGATIVE
Comment: NEGATIVE
Comment: NEGATIVE
Comment: NORMAL
Diagnosis: NEGATIVE
Diagnosis: REACTIVE
High risk HPV: NEGATIVE
Neisseria Gonorrhea: NEGATIVE
Trichomonas: NEGATIVE
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
## 2024-08-15 ENCOUNTER — Ambulatory Visit: Payer: Self-pay | Admitting: Certified Nurse Midwife

## 2024-08-15 DIAGNOSIS — Z3041 Encounter for surveillance of contraceptive pills: Secondary | ICD-10-CM

## 2024-08-15 DIAGNOSIS — Z1231 Encounter for screening mammogram for malignant neoplasm of breast: Secondary | ICD-10-CM

## 2024-08-15 DIAGNOSIS — Z01419 Encounter for gynecological examination (general) (routine) without abnormal findings: Secondary | ICD-10-CM

## 2024-08-15 DIAGNOSIS — Z13220 Encounter for screening for lipoid disorders: Secondary | ICD-10-CM

## 2024-08-15 DIAGNOSIS — Z131 Encounter for screening for diabetes mellitus: Secondary | ICD-10-CM

## 2024-08-15 DIAGNOSIS — Z1329 Encounter for screening for other suspected endocrine disorder: Secondary | ICD-10-CM

## 2024-09-12 ENCOUNTER — Ambulatory Visit: Payer: Self-pay | Admitting: Certified Nurse Midwife
